# Patient Record
Sex: Female | Born: 1990 | Hispanic: No | Marital: Married | Smoking: Former smoker
Health system: Southern US, Community
[De-identification: ages and names within clinical notes are randomized; demographics above are authoritative.]

## PROBLEM LIST (undated history)

## (undated) DIAGNOSIS — R87629 Unspecified abnormal cytological findings in specimens from vagina: Secondary | ICD-10-CM

## (undated) DIAGNOSIS — N200 Calculus of kidney: Secondary | ICD-10-CM

## (undated) DIAGNOSIS — O039 Complete or unspecified spontaneous abortion without complication: Secondary | ICD-10-CM

## (undated) DIAGNOSIS — J302 Other seasonal allergic rhinitis: Secondary | ICD-10-CM

## (undated) DIAGNOSIS — N189 Chronic kidney disease, unspecified: Secondary | ICD-10-CM

## (undated) HISTORY — PX: FEMUR FRACTURE SURGERY: SHX633

## (undated) HISTORY — DX: Chronic kidney disease, unspecified: N18.9

## (undated) HISTORY — DX: Complete or unspecified spontaneous abortion without complication: O03.9

## (undated) HISTORY — DX: Other seasonal allergic rhinitis: J30.2

## (undated) HISTORY — DX: Unspecified abnormal cytological findings in specimens from vagina: R87.629

## (undated) HISTORY — PX: FRACTURE SURGERY: SHX138

---
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
## 2011-08-15 ENCOUNTER — Emergency Department (HOSPITAL_COMMUNITY)
Admission: EM | Admit: 2011-08-15 | Discharge: 2011-08-16 | Disposition: A | Discharge status: Home / Self Care | Payer: Self-pay | Attending: Emergency Medicine | Admitting: Emergency Medicine

## 2011-08-15 DIAGNOSIS — R197 Diarrhea, unspecified: Secondary | ICD-10-CM | POA: Insufficient documentation

## 2011-08-15 DIAGNOSIS — R112 Nausea with vomiting, unspecified: Secondary | ICD-10-CM | POA: Insufficient documentation

## 2011-08-15 DIAGNOSIS — R1084 Generalized abdominal pain: Secondary | ICD-10-CM | POA: Insufficient documentation

## 2011-08-16 ENCOUNTER — Emergency Department (HOSPITAL_COMMUNITY): Payer: Self-pay

## 2011-08-16 ENCOUNTER — Encounter (HOSPITAL_COMMUNITY): Payer: Self-pay | Admitting: *Deleted

## 2011-08-16 LAB — URINALYSIS, ROUTINE W REFLEX MICROSCOPIC
Bilirubin Urine: NEGATIVE
Glucose, UA: NEGATIVE mg/dL
Nitrite: NEGATIVE
Specific Gravity, Urine: 1.019 (ref 1.005–1.030)
pH: 6.5 (ref 5.0–8.0)

## 2011-08-16 LAB — CBC
HCT: 39.9 % (ref 36.0–46.0)
MCV: 82.8 fL (ref 78.0–100.0)
RBC: 4.82 MIL/uL (ref 3.87–5.11)
RDW: 12.8 % (ref 11.5–15.5)
WBC: 15.4 10*3/uL — ABNORMAL HIGH (ref 4.0–10.5)

## 2011-08-16 LAB — HEPATIC FUNCTION PANEL
ALT: 12 U/L (ref 0–35)
AST: 17 U/L (ref 0–37)
Alkaline Phosphatase: 75 U/L (ref 39–117)
Bilirubin, Direct: 0.2 mg/dL (ref 0.0–0.3)
Indirect Bilirubin: 0 mg/dL — ABNORMAL LOW (ref 0.3–0.9)

## 2011-08-16 LAB — POCT I-STAT, CHEM 8
BUN: 11 mg/dL (ref 6–23)
Calcium, Ion: 1.17 mmol/L (ref 1.12–1.32)
Chloride: 104 mEq/L (ref 96–112)
Glucose, Bld: 96 mg/dL (ref 70–99)
Potassium: 3.6 mEq/L (ref 3.5–5.1)

## 2011-08-16 LAB — DIFFERENTIAL
Basophils Absolute: 0 10*3/uL (ref 0.0–0.1)
Eosinophils Relative: 3 % (ref 0–5)
Lymphocytes Relative: 10 % — ABNORMAL LOW (ref 12–46)
Lymphs Abs: 1.5 10*3/uL (ref 0.7–4.0)
Monocytes Absolute: 1.1 10*3/uL — ABNORMAL HIGH (ref 0.1–1.0)
Neutro Abs: 12.3 10*3/uL — ABNORMAL HIGH (ref 1.7–7.7)

## 2011-08-16 LAB — URINE MICROSCOPIC-ADD ON

## 2011-08-16 MED ORDER — PROMETHAZINE HCL 25 MG RE SUPP: 12.5000 mg | Freq: Four times a day (QID) | RECTAL | Status: DC | PRN

## 2011-08-16 MED ORDER — IOHEXOL 300 MG/ML  SOLN
100.0000 mL | Freq: Once | INTRAMUSCULAR | Status: AC | PRN
  Administered 2011-08-16: 100 mL via INTRAVENOUS

## 2011-08-16 MED ORDER — FENTANYL CITRATE 0.05 MG/ML IJ SOLN
50.0000 ug | Freq: Once | INTRAMUSCULAR | Status: AC
  Administered 2011-08-16: 50 ug via INTRAVENOUS
  Filled 2011-08-16: qty 2

## 2011-08-16 MED ORDER — ONDANSETRON HCL 4 MG/2ML IJ SOLN
4.0000 mg | Freq: Once | INTRAMUSCULAR | Status: AC
  Administered 2011-08-16: 4 mg via INTRAVENOUS
  Filled 2011-08-16: qty 2

## 2011-08-16 MED ORDER — SODIUM CHLORIDE 0.9 % IV BOLUS (SEPSIS)
1000.0000 mL | Freq: Once | INTRAVENOUS | Status: AC
  Administered 2011-08-16: 1000 mL via INTRAVENOUS

## 2011-08-16 MED ORDER — PROMETHAZINE HCL 25 MG PO TABS: 12.5000 mg | ORAL_TABLET | Freq: Four times a day (QID) | ORAL | Status: AC | PRN

## 2011-08-16 MED ORDER — IOHEXOL 300 MG/ML  SOLN
20.0000 mL | INTRAMUSCULAR | Status: AC
  Administered 2011-08-16: 20 mL via ORAL

## 2011-08-16 NOTE — ED Notes (Signed)
Gives several day history of upper abd pain associated with N/V and loose stools, no fever or urinary sx. Period ended on Friday and denies any spotting.

## 2011-08-16 NOTE — ED Provider Notes (Signed)
History     CSN: [id number]  Arrival date & time 08/15/11  2344   First MD Initiated Contact with Patient 08/16/11 0059      Chief Complaint  Patient presents with  . Abdominal Pain  . Emesis    (Consider location/radiation/quality/duration/timing/severity/associated sxs/prior treatment) Patient is a 21 y.o. female presenting with abdominal pain and vomiting. The history is provided by the patient and a parent. No language interpreter was used.  Abdominal Pain The primary symptoms of the illness include abdominal pain, nausea, vomiting and diarrhea. The current episode started more than 2 days ago. The onset of the illness was sudden. Progression since onset: better and now returned this evening.  The abdominal pain began 3 to 5 hours ago. The pain came on gradually. The abdominal pain has been unchanged since its onset. The abdominal pain is generalized. The abdominal pain does not radiate.  The diarrhea began 3 to 5 days ago. The diarrhea is watery. Risk factors: unkown but that resolved on Tuesday.  Associated with: nothing. The patient states that she believes she is currently not pregnant. The patient has had a change in bowel habit. Risk factors: none. Symptoms associated with the illness do not include heartburn, constipation or frequency. Significant associated medical issues do not include PUD or GERD.  Emesis  Associated symptoms include abdominal pain and diarrhea.    History reviewed. No pertinent past medical history.  Past Surgical History  Procedure Date  . Fracture surgery   . Femur fracture surgery     Family History  Problem Relation Age of Onset  . Hypertension Mother   . Nephrolithiasis Mother     History  Substance Use Topics  . Smoking status: Former Smoker    Quit date: 08/09/2011  . Smokeless tobacco: Not on file  . Alcohol Use: No    OB History    Grav Para Term Preterm Abortions TAB SAB Ect Mult Living                  Review of Systems    Gastrointestinal: Positive for nausea, vomiting, abdominal pain and diarrhea. Negative for heartburn and constipation.  Genitourinary: Negative for frequency.  All other systems reviewed and are negative.    Allergies  Review of patient's allergies indicates no known allergies.  Home Medications  No current outpatient prescriptions on file.  BP 134/74  Pulse 94  Temp(Src) 98.3 F (36.8 C) (Oral)  Resp 18  SpO2 100%  LMP 08/12/2011  Physical Exam  Constitutional: She is oriented to person, place, and time. She appears well-developed and well-nourished.  HENT:  Head: Normocephalic and atraumatic.  Mouth/Throat: Oropharynx is clear and moist. No oropharyngeal exudate.  Eyes: Conjunctivae are normal. Pupils are equal, round, and reactive to light.  Neck: Normal range of motion. Neck supple.  Cardiovascular: Normal rate and regular rhythm.   Pulmonary/Chest: Effort normal and breath sounds normal.  Abdominal: Soft. Bowel sounds are normal. There is no tenderness. There is no rebound and no guarding.  Musculoskeletal: Normal range of motion.  Neurological: She is alert and oriented to person, place, and time.  Skin: Skin is warm and dry. She is not diaphoretic.  Psychiatric: She has a normal mood and affect.    ED Course  Procedures (including critical care time)  Labs Reviewed  CBC - Abnormal; Notable for the following:    WBC 15.4 (*)    All other components within normal limits  DIFFERENTIAL - Abnormal; Notable for the following:  Neutrophils Relative 80 (*)    Neutro Abs 12.3 (*)    Lymphocytes Relative 10 (*)    Monocytes Absolute 1.1 (*)    All other components within normal limits  POCT I-STAT, CHEM 8  URINALYSIS, ROUTINE W REFLEX MICROSCOPIC  HEPATIC FUNCTION PANEL  LIPASE, BLOOD   No results found.   No diagnosis found.    MDM  Patient still on menstrual cycle to exam blood in urine.    Patient informed of stones in kidney and to have close  follow up.  Patient and father verbalize understanding and agree to follow up       Vaishnavi Dalby Smitty Cords, MD 08/16/11 [phone]

## 2011-08-16 NOTE — ED Notes (Signed)
Call to CT re: oral contrast completed

## 2011-08-16 NOTE — Discharge Instructions (Signed)
B.R.A.T. Diet Your doctor has recommended the B.R.A.T. diet for you or your child until the condition improves. This is often used to help control diarrhea and vomiting symptoms. If you or your child can tolerate clear liquids, you may have:  Bananas.   Rice.   Applesauce.   Toast (and other simple starches such as crackers, potatoes, noodles).  Be sure to avoid dairy products, meats, and fatty foods until symptoms are better. Fruit juices such as apple, grape, and prune juice can make diarrhea worse. Avoid these. Continue this diet for 2 days or as instructed by your caregiver. Document Released: 03/31/2005 Document Revised: 03/20/2011 Document Reviewed: 09/17/2006 ExitCare Patient Information 2012 [employer]. 

## 2011-08-16 NOTE — ED Notes (Addendum)
C/o stomach cramps and nv. Onset Tuesday. Comes and goes. Went away Tuesday and came back tonight around 2200. last emesis at ~ 15 minutes PTA. Had diarrhea on Tuesday. Last BM this am (normal). No meds PTA. "no chance of pregnancy". Last ate 1900. (denies: dizziness, fever, urinary or vaginal sx, back pain, bleeding or other sx).

## 2013-02-25 ENCOUNTER — Encounter: Payer: Self-pay | Admitting: *Deleted

## 2013-03-01 ENCOUNTER — Other Ambulatory Visit: Payer: Self-pay | Admitting: Obstetrics & Gynecology

## 2013-03-01 DIAGNOSIS — O3680X0 Pregnancy with inconclusive fetal viability, not applicable or unspecified: Secondary | ICD-10-CM

## 2013-03-02 ENCOUNTER — Encounter (INDEPENDENT_AMBULATORY_CARE_PROVIDER_SITE_OTHER): Payer: Self-pay

## 2013-03-02 ENCOUNTER — Ambulatory Visit (INDEPENDENT_AMBULATORY_CARE_PROVIDER_SITE_OTHER): Payer: Medicaid Other

## 2013-03-02 DIAGNOSIS — O3680X0 Pregnancy with inconclusive fetal viability, not applicable or unspecified: Secondary | ICD-10-CM

## 2013-03-02 NOTE — Progress Notes (Signed)
U/S(6+5wks)-single IUP with +FCA noted, FHR-134 bpm, cx long and closed, bilateral adnexa WNL, no free fluid noted within pelvis, CRL c/w LMP dates

## 2013-03-15 ENCOUNTER — Ambulatory Visit (INDEPENDENT_AMBULATORY_CARE_PROVIDER_SITE_OTHER): Payer: Medicaid Other | Admitting: Women's Health

## 2013-03-15 ENCOUNTER — Other Ambulatory Visit (HOSPITAL_COMMUNITY)
Admission: RE | Admit: 2013-03-15 | Discharge: 2013-03-15 | Disposition: A | Discharge status: Home / Self Care | Payer: Medicaid Other | Source: Ambulatory Visit | Attending: Obstetrics & Gynecology | Admitting: Obstetrics & Gynecology

## 2013-03-15 ENCOUNTER — Encounter: Payer: Self-pay | Admitting: Women's Health

## 2013-03-15 VITALS — BP 112/64 | Ht 61.0 in | Wt 175.8 lb

## 2013-03-15 DIAGNOSIS — O360990 Maternal care for other rhesus isoimmunization, unspecified trimester, not applicable or unspecified: Secondary | ICD-10-CM

## 2013-03-15 DIAGNOSIS — Z3401 Encounter for supervision of normal first pregnancy, first trimester: Secondary | ICD-10-CM

## 2013-03-15 DIAGNOSIS — Z1151 Encounter for screening for human papillomavirus (HPV): Secondary | ICD-10-CM | POA: Insufficient documentation

## 2013-03-15 DIAGNOSIS — O219 Vomiting of pregnancy, unspecified: Secondary | ICD-10-CM

## 2013-03-15 DIAGNOSIS — Z113 Encounter for screening for infections with a predominantly sexual mode of transmission: Secondary | ICD-10-CM | POA: Insufficient documentation

## 2013-03-15 DIAGNOSIS — Z331 Pregnant state, incidental: Secondary | ICD-10-CM

## 2013-03-15 DIAGNOSIS — Z1389 Encounter for screening for other disorder: Secondary | ICD-10-CM

## 2013-03-15 DIAGNOSIS — Z124 Encounter for screening for malignant neoplasm of cervix: Secondary | ICD-10-CM | POA: Insufficient documentation

## 2013-03-15 DIAGNOSIS — Z3400 Encounter for supervision of normal first pregnancy, unspecified trimester: Secondary | ICD-10-CM | POA: Insufficient documentation

## 2013-03-15 DIAGNOSIS — R87810 Cervical high risk human papillomavirus (HPV) DNA test positive: Secondary | ICD-10-CM | POA: Insufficient documentation

## 2013-03-15 DIAGNOSIS — O210 Mild hyperemesis gravidarum: Secondary | ICD-10-CM

## 2013-03-15 DIAGNOSIS — Z23 Encounter for immunization: Secondary | ICD-10-CM

## 2013-03-15 LAB — POCT URINALYSIS DIPSTICK
Ketones, UA: NEGATIVE
Leukocytes, UA: NEGATIVE
Nitrite, UA: NEGATIVE
Protein, UA: NEGATIVE

## 2013-03-15 LAB — CBC
MCH: 29.2 pg (ref 26.0–34.0)
MCHC: 34 g/dL (ref 30.0–36.0)
Platelets: 216 10*3/uL (ref 150–400)
RDW: 13.7 % (ref 11.5–15.5)

## 2013-03-15 MED ORDER — DOXYLAMINE-PYRIDOXINE 10-10 MG PO TBEC: 10.0000 mg | DELAYED_RELEASE_TABLET | ORAL | Status: DC

## 2013-03-15 MED ORDER — INFLUENZA VAC SPLIT QUAD 0.5 ML IM SUSP
0.5000 mL | Freq: Once | INTRAMUSCULAR | Status: AC
  Administered 2013-03-15: 0.5 mL via INTRAMUSCULAR

## 2013-03-15 NOTE — Progress Notes (Addendum)
  Subjective:    Jodi King is a 22 y.o. G1P0 Caucasian female at [redacted]w[redacted]d by LMP which correlates well w/ 6wk u/s, being seen today for her first obstetrical visit.  Her obstetrical history is significant for smoker and primigravida.  She was smoking 1ppd and drinking etoh often prior to pregnancy, has quit both since +PT. Pregnancy history fully reviewed.   Patient reports daily n/v. Denies cramping, vb, uti s/s. Ceasar Mons Vitals:   03/15/13 0938  BP: 112/64  Weight: 175 lb 12.8 oz (79.742 kg)    HISTORY: OB History  Gravida Para Term Preterm AB SAB TAB Ectopic Multiple Living  1             # Outcome Date GA Lbr Len/2nd Weight Sex Delivery Anes PTL Lv  1 CUR              Past Medical History  Diagnosis Date  . Chronic kidney disease     kidney stones  . Miscarriage    Past Surgical History  Procedure Laterality Date  . Fracture surgery    . Femur fracture surgery     Family History  Problem Relation Age of Onset  . Hypertension Mother   . Nephrolithiasis Mother      Exam    Pelvic Exam:    Perineum: Normal Perineum   Vulva: normal   Vagina:  normal mucosa, normal discharge, no palpable nodules   Uterus   normal size/shape/contour for ga     Cervix: normal   Adnexa: Not palpable   Urinary: urethral meatus normal    System:     Skin: normal coloration and turgor, no rashes    Neurologic: oriented, normal mood   Extremities: normal strength, tone, and muscle mass   HEENT PERRLA   Mouth/Teeth mucous membranes moist   Cardiovascular: regular rate and rhythm   Respiratory:  appears well, vitals normal, no respiratory distress, acyanotic, normal RR   Abdomen: soft, non-tender    Thin prep pap smear obtained FHR 150s via informal transabdominal u/s   Assessment:    Pregnancy: G1P0 Patient Active Problem List   Diagnosis Date Noted  . Supervision of normal first pregnancy 03/15/2013    Priority: High      [redacted]w[redacted]d G1P0 New OB visit Previous  smoker N/V of pregnancy    Plan:     Initial labs drawn Continue prenatal vitamins Problem list reviewed and updated Reviewed n/v relief measures and warning s/s to report Rx diclegis, prior auth sent, and 2 samples given Reviewed recommended weight gain based on pre-gravid BMI Encouraged well-balanced diet Genetic Screening discussed Integrated Screen: requested Cystic fibrosis screening discussed requested Ultrasound discussed; fetal survey: requested Follow up in 4 weeks for 1st it/nt and visit [facility] referral faxed  Marge Duncans 03/15/2013 10:41 AM

## 2013-03-15 NOTE — Patient Instructions (Signed)
Nausea & Vomiting  Have saltine crackers or pretzels by your bed and eat a few bites before you raise your head out of bed in the morning  Eat small frequent meals throughout the day instead of large meals  Drink plenty of fluids throughout the day to stay hydrated, just don't drink a lot of fluids with your meals.  This can make your stomach fill up faster making you feel sick  Do not brush your teeth right after you eat  Products with real ginger are good for nausea, like ginger ale and ginger hard candy Make sure it says made with real ginger!  Sucking on sour candy like lemon heads is also good for nausea  If your prenatal vitamins make you nauseated, take them at night so you will sleep through the nausea  If you feel like you need medicine for the nausea & vomiting please let us know  If you are unable to keep any fluids or food down please let us know    Pregnancy - First Trimester During sexual intercourse, millions of sperm go into the vagina. Only 1 sperm will penetrate and fertilize the female egg while it is in the Fallopian tube. One week later, the fertilized egg implants into the wall of the uterus. An embryo begins to develop into a baby. At 6 to 8 weeks, the eyes and face are formed and the heartbeat can be seen on ultrasound. At the end of 12 weeks (first trimester), all the baby's organs are formed. Now that you are pregnant, you will want to do everything you can to have a healthy baby. Two of the most important things are to get good prenatal care and follow your caregiver's instructions. Prenatal care is all the medical care you receive before the baby's birth. It is given to prevent, find, and treat problems during the pregnancy and childbirth. PRENATAL EXAMS  During prenatal visits, your weight, blood pressure, and urine are checked. This is done to make sure you are healthy and progressing normally during the pregnancy.  A pregnant woman should gain 25 to 35 pounds  during the pregnancy. However, if you are overweight or underweight, your caregiver will advise you regarding your weight.  Your caregiver will ask and answer questions for you.  Blood work, cervical cultures, other necessary tests, and a Pap test are done during your prenatal exams. These tests are done to check on your health and the probable health of your baby. Tests are strongly recommended and done for HIV with your permission. This is the virus that causes AIDS. These tests are done because medicines can be given to help prevent your baby from being born with this infection should you have been infected without knowing it. Blood work is also used to find out your blood type, previous infections, and follow your blood levels (hemoglobin).  Low hemoglobin (anemia) is common during pregnancy. Iron and vitamins are given to help prevent this. Later in the pregnancy, blood tests for diabetes will be done along with any other tests if any problems develop.  You may need other tests to make sure you and the baby are doing well. CHANGES DURING THE FIRST TRIMESTER  Your body goes through many changes during pregnancy. They vary from person to person. Talk to your caregiver about changes you notice and are concerned about. Changes can include:  Your menstrual period stops.  The egg and sperm carry the genes that determine what you look like. Genes from you   and your partner are forming a baby. The female genes determine whether the baby is a boy or a girl.  Your body increases in girth and you may feel bloated.  Feeling sick to your stomach (nauseous) and throwing up (vomiting). If the vomiting is uncontrollable, call your caregiver.  Your breasts will begin to enlarge and become tender.  Your nipples may stick out more and become darker.  The need to urinate more. Painful urination may mean you have a bladder infection.  Tiring easily.  Loss of appetite.  Cravings for certain kinds of  food.  At first, you may gain or lose a couple of pounds.  You may have changes in your emotions from day to day (excited to be pregnant or concerned something may go wrong with the pregnancy and baby).  You may have more vivid and strange dreams. HOME CARE INSTRUCTIONS   It is very important to avoid all smoking, alcohol and non-prescribed drugs during your pregnancy. These affect the formation and growth of the baby. Avoid chemicals while pregnant to ensure the delivery of a healthy infant.  Start your prenatal visits by the 12th week of pregnancy. They are usually scheduled monthly at first, then more often in the last 2 months before delivery. Keep your caregiver's appointments. Follow your caregiver's instructions regarding medicine use, blood and lab tests, exercise, and diet.  During pregnancy, you are providing food for you and your baby. Eat regular, well-balanced meals. Choose foods such as meat, fish, milk and other low fat dairy products, vegetables, fruits, and whole-grain breads and cereals. Your caregiver will tell you of the ideal weight gain.  You can help morning sickness by keeping soda crackers at the bedside. Eat a couple before arising in the morning. You may want to use the crackers without salt on them.  Eating 4 to 5 small meals rather than 3 large meals a day also may help the nausea and vomiting.  Drinking liquids between meals instead of during meals also seems to help nausea and vomiting.  A physical sexual relationship may be continued throughout pregnancy if there are no other problems. Problems may be early (premature) leaking of amniotic fluid from the membranes, vaginal bleeding, or belly (abdominal) pain.  Exercise regularly if there are no restrictions. Check with your caregiver or physical therapist if you are unsure of the safety of some of your exercises. Greater weight gain will occur in the last 2 trimesters of pregnancy. Exercising will  help:  Control your weight.  Keep you in shape.  Prepare you for labor and delivery.  Help you lose your pregnancy weight after you deliver your baby.  Wear a good support or jogging bra for breast tenderness during pregnancy. This may help if worn during sleep too.  Ask when prenatal classes are available. Begin classes when they are offered.  Do not use hot tubs, steam rooms, or saunas.  Wear your seat belt when driving. This protects you and your baby if you are in an accident.  Avoid raw meat, uncooked cheese, cat litter boxes, and soil used by cats throughout the pregnancy. These carry germs that can cause birth defects in the baby.  The first trimester is a good time to visit your dentist for your dental health. Getting your teeth cleaned is okay. Use a softer toothbrush and brush gently during pregnancy.  Ask for help if you have financial, counseling, or nutritional needs during pregnancy. Your caregiver will be able to offer counseling for   these needs as well as refer you for other special needs.  Do not take any medicines or herbs unless told by your caregiver.  Inform your caregiver if there is any mental or physical domestic violence.  Make a list of emergency phone numbers of family, friends, hospital, and police and fire departments.  Write down your questions. Take them to your prenatal visit.  Do not douche.  Do not cross your legs.  If you have to stand for long periods of time, rotate you feet or take small steps in a circle.  You may have more vaginal secretions that may require a sanitary pad. Do not use tampons or scented sanitary pads. MEDICINES AND DRUG USE IN PREGNANCY  Take prenatal vitamins as directed. The vitamin should contain 1 milligram of folic acid. Keep all vitamins out of reach of children. Only a couple vitamins or tablets containing iron may be fatal to a baby or young child when ingested.  Avoid use of all medicines, including herbs,  over-the-counter medicines, not prescribed or suggested by your caregiver. Only take over-the-counter or prescription medicines for pain, discomfort, or fever as directed by your caregiver. Do not use aspirin, ibuprofen, or naproxen unless directed by your caregiver.  Let your caregiver also know about herbs you may be using.  Alcohol is related to a number of birth defects. This includes fetal alcohol syndrome. All alcohol, in any form, should be avoided completely. Smoking will cause low birth rate and premature babies.  Street or illegal drugs are very harmful to the baby. They are absolutely forbidden. A baby born to an addicted mother will be addicted at birth. The baby will go through the same withdrawal an adult does.  Let your caregiver know about any medicines that you have to take and for what reason you take them. SEEK MEDICAL CARE IF:  You have any concerns or worries during your pregnancy. It is better to call with your questions if you feel they cannot wait, rather than worry about them. SEEK IMMEDIATE MEDICAL CARE IF:   An unexplained oral temperature above 102 F (38.9 C) develops, or as your caregiver suggests.  You have leaking of fluid from the vagina (birth canal). If leaking membranes are suspected, take your temperature and inform your caregiver of this when you call.  There is vaginal spotting or bleeding. Notify your caregiver of the amount and how many pads are used.  You develop a bad smelling vaginal discharge with a change in the color.  You continue to feel sick to your stomach (nauseated) and have no relief from remedies suggested. You vomit blood or coffee ground-like materials.  You lose more than 2 pounds of weight in 1 week.  You gain more than 2 pounds of weight in 1 week and you notice swelling of your face, hands, feet, or legs.  You gain 5 pounds or more in 1 week (even if you do not have swelling of your hands, face, legs, or feet).  You get  exposed to German measles and have never had them.  You are exposed to fifth disease or chickenpox.  You develop belly (abdominal) pain. Round ligament discomfort is a common non-cancerous (benign) cause of abdominal pain in pregnancy. Your caregiver still must evaluate this.  You develop headache, fever, diarrhea, pain with urination, or shortness of breath.  You fall or are in a car accident or have any kind of trauma.  There is mental or physical violence in your home. Document   Released: 03/25/2001 Document Revised: 12/24/2011 Document Reviewed: 09/26/2008 [employer] Patient Information 2014 [employer].  

## 2013-03-16 LAB — URINALYSIS
Hgb urine dipstick: NEGATIVE
Ketones, ur: NEGATIVE mg/dL
Leukocytes, UA: NEGATIVE
Nitrite: NEGATIVE
Protein, ur: NEGATIVE mg/dL
Urobilinogen, UA: 0.2 mg/dL (ref 0.0–1.0)
pH: 8.5 — ABNORMAL HIGH (ref 5.0–8.0)

## 2013-03-16 LAB — DRUG SCREEN, URINE, NO CONFIRMATION
Amphetamine Screen, Ur: NEGATIVE
Barbiturate Quant, Ur: NEGATIVE
Benzodiazepines.: NEGATIVE
Methadone: NEGATIVE
Propoxyphene: NEGATIVE

## 2013-03-16 LAB — CYSTIC FIBROSIS DIAGNOSTIC STUDY

## 2013-03-16 LAB — OXYCODONE SCREEN, UA, RFLX CONFIRM: Oxycodone Screen, Ur: NEGATIVE ng/mL

## 2013-03-16 LAB — URINE CULTURE

## 2013-03-16 LAB — ANTIBODY SCREEN: Antibody Screen: NEGATIVE

## 2013-03-16 LAB — ABO AND RH: Rh Type: NEGATIVE

## 2013-03-16 LAB — HEPATITIS B SURFACE ANTIGEN: Hepatitis B Surface Ag: NEGATIVE

## 2013-03-16 LAB — RUBELLA SCREEN: Rubella: 2.57 Index — ABNORMAL HIGH (ref <0.90)

## 2013-03-16 LAB — HIV ANTIBODY (ROUTINE TESTING W REFLEX): HIV: NONREACTIVE

## 2013-03-16 LAB — RPR

## 2013-03-18 ENCOUNTER — Encounter: Payer: Self-pay | Admitting: Women's Health

## 2013-03-18 DIAGNOSIS — Z6791 Unspecified blood type, Rh negative: Secondary | ICD-10-CM | POA: Insufficient documentation

## 2013-03-18 DIAGNOSIS — O26899 Other specified pregnancy related conditions, unspecified trimester: Secondary | ICD-10-CM

## 2013-03-18 DIAGNOSIS — Z283 Underimmunization status: Secondary | ICD-10-CM

## 2013-03-18 DIAGNOSIS — O09899 Supervision of other high risk pregnancies, unspecified trimester: Secondary | ICD-10-CM | POA: Insufficient documentation

## 2013-03-19 ENCOUNTER — Encounter: Payer: Self-pay | Admitting: Women's Health

## 2013-03-21 ENCOUNTER — Telehealth: Payer: Self-pay | Admitting: Women's Health

## 2013-03-21 ENCOUNTER — Encounter: Payer: Self-pay | Admitting: Women's Health

## 2013-03-21 NOTE — Telephone Encounter (Signed)
No answer x 2. Will send note through my chart about abnormal pap and need for colpo at >[redacted]wks GA.  Cheral Marker, CNM, [facility] 03/21/2013 1:34 PM

## 2013-03-21 NOTE — Telephone Encounter (Signed)
Attempting to call pt to notify her of abnormal pap. No answer, no option to leave voicemail.  Cheral Marker, CNM, [facility] 03/21/2013 1:27 PM

## 2013-04-01 ENCOUNTER — Telehealth: Payer: Self-pay | Admitting: Obstetrics & Gynecology

## 2013-04-01 NOTE — Telephone Encounter (Signed)
Informed pt Diclegis e-scribed to [place], [place].

## 2013-04-01 NOTE — Telephone Encounter (Signed)
Spoke with pt letting her know pap smear came back showing some abnormal cells. Pt needs to have a colpo after the 1st trimester. Has a scheduled appt on 04/12/13 but will still be in the 1st trimester so I advised procedure would not be done that day. Pt also asked about Diclegis and I let her know that it had been sent to pharmacy. Pt voiced understanding. JSY

## 2013-04-12 ENCOUNTER — Other Ambulatory Visit: Payer: Self-pay | Admitting: Obstetrics & Gynecology

## 2013-04-12 ENCOUNTER — Ambulatory Visit (INDEPENDENT_AMBULATORY_CARE_PROVIDER_SITE_OTHER): Payer: Medicaid Other

## 2013-04-12 ENCOUNTER — Encounter: Payer: Self-pay | Admitting: Obstetrics & Gynecology

## 2013-04-12 ENCOUNTER — Other Ambulatory Visit: Payer: Self-pay | Admitting: Women's Health

## 2013-04-12 ENCOUNTER — Ambulatory Visit (INDEPENDENT_AMBULATORY_CARE_PROVIDER_SITE_OTHER): Payer: Medicaid Other | Admitting: Obstetrics & Gynecology

## 2013-04-12 VITALS — BP 112/72 | Wt 175.5 lb

## 2013-04-12 DIAGNOSIS — Z3401 Encounter for supervision of normal first pregnancy, first trimester: Secondary | ICD-10-CM

## 2013-04-12 DIAGNOSIS — Z1389 Encounter for screening for other disorder: Secondary | ICD-10-CM

## 2013-04-12 DIAGNOSIS — O360990 Maternal care for other rhesus isoimmunization, unspecified trimester, not applicable or unspecified: Secondary | ICD-10-CM

## 2013-04-12 DIAGNOSIS — Z36 Encounter for antenatal screening of mother: Secondary | ICD-10-CM

## 2013-04-12 DIAGNOSIS — Z3402 Encounter for supervision of normal first pregnancy, second trimester: Secondary | ICD-10-CM

## 2013-04-12 DIAGNOSIS — Z331 Pregnant state, incidental: Secondary | ICD-10-CM

## 2013-04-12 LAB — POCT URINALYSIS DIPSTICK
Blood, UA: NEGATIVE
Ketones, UA: NEGATIVE
Protein, UA: NEGATIVE

## 2013-04-12 NOTE — Progress Notes (Signed)
Sciatica, reviewed standard strategies for management No bleeding  No complaints Follow up in 4 weeks

## 2013-04-12 NOTE — Progress Notes (Signed)
U/S(12+4wks)-single IUP with +FCA noted, FHR-167 bpm, cx long and closed (3.3cm), bilateral adnexa wnl, NB present, NT-1.91mm, CRL c/w dates

## 2013-04-14 NOTE — L&D Delivery Note (Signed)
Delivery Note At 5:39 PM a viable female was delivered via Vaginal, Spontaneous Delivery (Presentation: Left Occiput Anterior).  APGAR: 8, 9; weight TBD.   Placenta status: Intact, Spontaneous.  Cord: 3 vessels with the following complications: None.  Cord pH: 7.33  Anesthesia: Epidural  Episiotomy: Median Lacerations: 2nd degree Suture Repair: 3.0 vicryl rapide Est. Blood Loss (mL): 450cc  Mom to postpartum.  Baby to Couplet care / Skin to Skin.  Pt pushed for 1 hour with deep decelerations for the last 15 min down to the 90s with each contraction.  In the last 2 contractions the HR remained in the 70s between contractions. Mayo scissors were used to cut a median episiotomy and pt pushed one time after that and delivered a liveborn female via NSVD with spontaneous cry.  Tight nuchal cord x1 that had to be delivered through. Baby placed on maternal abdomen.  Delayed cord clamping performed.  Cord cut by FOB.  Placenta delivered intact with 3V cord via traction and pitocin.  2nd degree tear from episiotomy that did not extend. No complications.  Mom and baby to postpartum.   BECK, KELI L 10/11/2013, 6:12 PM

## 2013-04-18 LAB — MATERNAL SCREEN, INTEGRATED #1

## 2013-05-10 ENCOUNTER — Ambulatory Visit (INDEPENDENT_AMBULATORY_CARE_PROVIDER_SITE_OTHER): Payer: Medicaid Other | Admitting: Women's Health

## 2013-05-10 ENCOUNTER — Encounter: Payer: Self-pay | Admitting: Women's Health

## 2013-05-10 ENCOUNTER — Other Ambulatory Visit: Payer: Self-pay | Admitting: Obstetrics & Gynecology

## 2013-05-10 VITALS — BP 104/64 | Wt 175.4 lb

## 2013-05-10 DIAGNOSIS — Z1389 Encounter for screening for other disorder: Secondary | ICD-10-CM

## 2013-05-10 DIAGNOSIS — O9989 Other specified diseases and conditions complicating pregnancy, childbirth and the puerperium: Secondary | ICD-10-CM

## 2013-05-10 DIAGNOSIS — Z3400 Encounter for supervision of normal first pregnancy, unspecified trimester: Secondary | ICD-10-CM

## 2013-05-10 DIAGNOSIS — Z331 Pregnant state, incidental: Secondary | ICD-10-CM

## 2013-05-10 DIAGNOSIS — O99891 Other specified diseases and conditions complicating pregnancy: Secondary | ICD-10-CM

## 2013-05-10 DIAGNOSIS — O360990 Maternal care for other rhesus isoimmunization, unspecified trimester, not applicable or unspecified: Secondary | ICD-10-CM

## 2013-05-10 LAB — POCT URINALYSIS DIPSTICK
Glucose, UA: NEGATIVE
Ketones, UA: NEGATIVE
Leukocytes, UA: NEGATIVE
Nitrite, UA: NEGATIVE
PROTEIN UA: NEGATIVE

## 2013-05-10 NOTE — Patient Instructions (Signed)
Second Trimester of Pregnancy The second trimester is from week 13 through week 28, months 4 through 6. The second trimester is often a time when you feel your best. Your body has also adjusted to being pregnant, and you begin to feel better physically. Usually, morning sickness has lessened or quit completely, you may have more energy, and you may have an increase in appetite. The second trimester is also a time when the fetus is growing rapidly. At the end of the sixth month, the fetus is about 9 inches long and weighs about 1 pounds. You will likely begin to feel the baby move (quickening) between 18 and 20 weeks of the pregnancy. BODY CHANGES Your body goes through many changes during pregnancy. The changes vary from woman to woman.   Your weight will continue to increase. You will notice your lower abdomen bulging out.  You may begin to get stretch marks on your hips, abdomen, and breasts.  You may develop headaches that can be relieved by medicines approved by your caregiver.  You may urinate more often because the fetus is pressing on your bladder.  You may develop or continue to have heartburn as a result of your pregnancy.  You may develop constipation because certain hormones are causing the muscles that push waste through your intestines to slow down.  You may develop hemorrhoids or swollen, bulging veins (varicose veins).  You may have back pain because of the weight gain and pregnancy hormones relaxing your joints between the bones in your pelvis and as a result of a shift in weight and the muscles that support your balance.  Your breasts will continue to grow and be tender.  Your gums may bleed and may be sensitive to brushing and flossing.  Dark spots or blotches (chloasma, mask of pregnancy) may develop on your face. This will likely fade after the baby is born.  A dark line from your belly button to the pubic area (linea nigra) may appear. This will likely fade after the  baby is born. WHAT TO EXPECT AT YOUR PRENATAL VISITS During a routine prenatal visit:  You will be weighed to make sure you and the fetus are growing normally.  Your blood pressure will be taken.  Your abdomen will be measured to track your baby's growth.  The fetal heartbeat will be listened to.  Any test results from the previous visit will be discussed. Your caregiver may ask you:  How you are feeling.  If you are feeling the baby move.  If you have had any abnormal symptoms, such as leaking fluid, bleeding, severe headaches, or abdominal cramping.  If you have any questions. Other tests that may be performed during your second trimester include:  Blood tests that check for:  Low iron levels (anemia).  Gestational diabetes (between 24 and 28 weeks).  Rh antibodies.  Urine tests to check for infections, diabetes, or protein in the urine.  An ultrasound to confirm the proper growth and development of the baby.  An amniocentesis to check for possible genetic problems.  Fetal screens for spina bifida and Down syndrome. HOME CARE INSTRUCTIONS   Avoid all smoking, herbs, alcohol, and unprescribed drugs. These chemicals affect the formation and growth of the baby.  Follow your caregiver's instructions regarding medicine use. There are medicines that are either safe or unsafe to take during pregnancy.  Exercise only as directed by your caregiver. Experiencing uterine cramps is a good sign to stop exercising.  Continue to eat regular,   healthy meals.  Wear a good support bra for breast tenderness.  Do not use hot tubs, steam rooms, or saunas.  Wear your seat belt at all times when driving.  Avoid raw meat, uncooked cheese, cat litter boxes, and soil used by cats. These carry germs that can cause birth defects in the baby.  Take your prenatal vitamins.  Try taking a stool softener (if your caregiver approves) if you develop constipation. Eat more high-fiber foods,  such as fresh vegetables or fruit and whole grains. Drink plenty of fluids to keep your urine clear or pale yellow.  Take warm sitz baths to soothe any pain or discomfort caused by hemorrhoids. Use hemorrhoid cream if your caregiver approves.  If you develop varicose veins, wear support hose. Elevate your feet for 15 minutes, 3 4 times a day. Limit salt in your diet.  Avoid heavy lifting, wear low heel shoes, and practice good posture.  Rest with your legs elevated if you have leg cramps or low back pain.  Visit your dentist if you have not gone yet during your pregnancy. Use a soft toothbrush to brush your teeth and be gentle when you floss.  A sexual relationship may be continued unless your caregiver directs you otherwise.  Continue to go to all your prenatal visits as directed by your caregiver. SEEK MEDICAL CARE IF:   You have dizziness.  You have mild pelvic cramps, pelvic pressure, or nagging pain in the abdominal area.  You have persistent nausea, vomiting, or diarrhea.  You have a bad smelling vaginal discharge.  You have pain with urination. SEEK IMMEDIATE MEDICAL CARE IF:   You have a fever.  You are leaking fluid from your vagina.  You have spotting or bleeding from your vagina.  You have severe abdominal cramping or pain.  You have rapid weight gain or loss.  You have shortness of breath with chest pain.  You notice sudden or extreme swelling of your face, hands, ankles, feet, or legs.  You have not felt your baby move in over an hour.  You have severe headaches that do not go away with medicine.  You have vision changes. Document Released: 03/25/2001 Document Revised: 12/01/2012 Document Reviewed: 06/01/2012 [employer] Patient Information 2014 [employer].  

## 2013-05-10 NOTE — Progress Notes (Signed)
Reports feeling some fm. Denies uc's, lof, vb, uti s/s.  No complaints.  Reviewed ptl s/s.  All questions answered. F/U in 4wks for anatomy u/s and visit w/ MD for colpo.  2nd IT today.

## 2013-05-13 LAB — MATERNAL SCREEN, INTEGRATED #2
AFP MoM: 0.88
AFP, Serum: 32.7 ng/mL
Age risk Down Syndrome: 1:1100 {titer}
CALCULATED GESTATIONAL AGE MAT SCREEN: 16.7
Crown Rump Length: 65.1 mm
Estriol Mom: 0.98
Estriol, Free: 0.97 ng/mL
HCG, SERUM MAT SCREEN: 17.7 [IU]/mL
Inhibin A Dimeric: 227 pg/mL
Inhibin A MoM: 1.43
MSS Down Syndrome: <1:5000 {titer}
NT MOM MAT SCREEN: 1.21
NUCHAL TRANSLUCENCY MAT SCREEN 2: 1.78 mm
Number of fetuses: 1
PAPP-A MOM MAT SCREEN: 0.75
PAPP-A: 611 ng/mL
Rish for ONTD: <1:5000 {titer}
hCG MoM: 0.61

## 2013-06-07 ENCOUNTER — Encounter: Payer: Medicaid Other | Admitting: Obstetrics & Gynecology

## 2013-06-07 ENCOUNTER — Other Ambulatory Visit: Payer: Medicaid Other

## 2013-06-08 ENCOUNTER — Ambulatory Visit (INDEPENDENT_AMBULATORY_CARE_PROVIDER_SITE_OTHER): Payer: Medicaid Other

## 2013-06-08 ENCOUNTER — Other Ambulatory Visit: Payer: Self-pay | Admitting: Women's Health

## 2013-06-08 ENCOUNTER — Ambulatory Visit (INDEPENDENT_AMBULATORY_CARE_PROVIDER_SITE_OTHER): Payer: Medicaid Other | Admitting: Women's Health

## 2013-06-08 ENCOUNTER — Encounter: Payer: Self-pay | Admitting: Women's Health

## 2013-06-08 VITALS — BP 130/60 | Wt 183.0 lb

## 2013-06-08 DIAGNOSIS — Z1389 Encounter for screening for other disorder: Secondary | ICD-10-CM

## 2013-06-08 DIAGNOSIS — Z3400 Encounter for supervision of normal first pregnancy, unspecified trimester: Secondary | ICD-10-CM

## 2013-06-08 DIAGNOSIS — Z331 Pregnant state, incidental: Secondary | ICD-10-CM

## 2013-06-08 DIAGNOSIS — O98519 Other viral diseases complicating pregnancy, unspecified trimester: Secondary | ICD-10-CM

## 2013-06-08 DIAGNOSIS — O99891 Other specified diseases and conditions complicating pregnancy: Secondary | ICD-10-CM

## 2013-06-08 DIAGNOSIS — O360990 Maternal care for other rhesus isoimmunization, unspecified trimester, not applicable or unspecified: Secondary | ICD-10-CM

## 2013-06-08 DIAGNOSIS — O9989 Other specified diseases and conditions complicating pregnancy, childbirth and the puerperium: Secondary | ICD-10-CM

## 2013-06-08 LAB — POCT URINALYSIS DIPSTICK
Glucose, UA: NEGATIVE
Ketones, UA: NEGATIVE
Leukocytes, UA: NEGATIVE
Nitrite, UA: NEGATIVE
PROTEIN UA: NEGATIVE

## 2013-06-08 NOTE — Patient Instructions (Signed)
Sciatica Sciatica is pain, weakness, numbness, or tingling along the path of the sciatic nerve. The nerve starts in the lower back and runs down the back of each leg. The nerve controls the muscles in the lower leg and in the back of the knee, while also providing sensation to the back of the thigh, lower leg, and the sole of your foot. Sciatica is a symptom of another medical condition. For instance, nerve damage or certain conditions, such as a herniated disk or bone spur on the spine, pinch or put pressure on the sciatic nerve. This causes the pain, weakness, or other sensations normally associated with sciatica. Generally, sciatica only affects one side of the body. CAUSES   Herniated or slipped disc.  Degenerative disk disease.  A pain disorder involving the narrow muscle in the buttocks (piriformis syndrome).  Pelvic injury or fracture.  Pregnancy.  Tumor (rare). SYMPTOMS  Symptoms can vary from mild to very severe. The symptoms usually travel from the low back to the buttocks and down the back of the leg. Symptoms can include:  Mild tingling or dull aches in the lower back, leg, or hip.  Numbness in the back of the calf or sole of the foot.  Burning sensations in the lower back, leg, or hip.  Sharp pains in the lower back, leg, or hip.  Leg weakness.  Severe back pain inhibiting movement. These symptoms may get worse with coughing, sneezing, laughing, or prolonged sitting or standing. Also, being overweight may worsen symptoms. DIAGNOSIS  Your caregiver will perform a physical exam to look for common symptoms of sciatica. He or she may ask you to do certain movements or activities that would trigger sciatic nerve pain. Other tests may be performed to find the cause of the sciatica. These may include:  Blood tests.  X-rays.  Imaging tests, such as an MRI or CT scan. TREATMENT  Treatment is directed at the cause of the sciatic pain. Sometimes, treatment is not necessary  and the pain and discomfort goes away on its own. If treatment is needed, your caregiver may suggest:  Over-the-counter medicines to relieve pain.  Prescription medicines, such as anti-inflammatory medicine, muscle relaxants, or narcotics.  Applying heat or ice to the painful area.  Steroid injections to lessen pain, irritation, and inflammation around the nerve.  Reducing activity during periods of pain.  Exercising and stretching to strengthen your abdomen and improve flexibility of your spine. Your caregiver may suggest losing weight if the extra weight makes the back pain worse.  Physical therapy.  Surgery to eliminate what is pressing or pinching the nerve, such as a bone spur or part of a herniated disk. HOME CARE INSTRUCTIONS   Only take over-the-counter or prescription medicines for pain or discomfort as directed by your caregiver.  Apply ice to the affected area for 20 minutes, 3 4 times a day for the first 48 72 hours. Then try heat in the same way.  Exercise, stretch, or perform your usual activities if these do not aggravate your pain.  Attend physical therapy sessions as directed by your caregiver.  Keep all follow-up appointments as directed by your caregiver.  Do not wear high heels or shoes that do not provide proper support.  Check your mattress to see if it is too soft. A firm mattress may lessen your pain and discomfort. SEEK IMMEDIATE MEDICAL CARE IF:   You lose control of your bowel or bladder (incontinence).  You have increasing weakness in the lower back,   pelvis, buttocks, or legs.  You have redness or swelling of your back.  You have a burning sensation when you urinate.  You have pain that gets worse when you lie down or awakens you at night.  Your pain is worse than you have experienced in the past.  Your pain is lasting longer than 4 weeks.  You are suddenly losing weight without reason. MAKE SURE YOU:  Understand these  instructions.  Will watch your condition.  Will get help right away if you are not doing well or get worse. Document Released: 03/25/2001 Document Revised: 09/30/2011 Document Reviewed: 08/10/2011 [facility] Patient Information 2014 [place], [place].  Colposcopy Colposcopy is a procedure to examine your cervix and vagina, or the area around the outside of your vagina, for abnormalities or signs of disease. The procedure is done using a lighted microscope called a colposcope. Tissue samples may be collected during the colposcopy if your health care provider finds any unusual cells. A colposcopy may be done if a woman has:  An abnormal Pap test. A Pap test is a medical test done to evaluate cells that are on the surface of the cervix.  A Pap test result that is suggestive of human papillomavirus (HPV). This virus can cause genital warts and is linked to the development of cervical cancer.  A sore on her cervix and the results of a Pap test were normal.  Genital warts on the cervix or in or around the outside of the vagina.  A mother who took the drug diethylstilbestrol (DES) while pregnant.  Painful intercourse.  Vaginal bleeding, especially after sexual intercourse. LET [facility] IncYOUR HEALTH CARE PROVIDER KNOW ABOUT:  Any allergies you have.  All medicines you are taking, including vitamins, herbs, eye drops, creams, and over-the-counter medicines.  Previous problems you or members of your family have had with the use of anesthetics.  Any blood disorders you have.  Previous surgeries you have had.  Medical conditions you have. RISKS AND COMPLICATIONS Generally, a colposcopy is a safe procedure. However, as with any procedure, complications can occur. Possible complications include:  Bleeding.  Infection.  Missed lesions. BEFORE THE PROCEDURE   Tell your health care provider if you have your menstrual period. A colposcopy typically is not done during menstruation.  For 24 hours  before the colposcopy, do not:  [place].  Use tampons.  Use medicines, creams, or suppositories in the vagina.  Have sexual intercourse. PROCEDURE  During the procedure, you will be lying on your back with your feet in foot rests (stirrups). A warm metal or plastic instrument (speculum) will be placed in your vagina to keep it open and to allow the health care provider to see the cervix. The colposcope will be placed outside the vagina. It will be used to magnify and examine the cervix, vagina, and the area around the outside of the vagina. A small amount of liquid solution will be placed on the area that is to be viewed. This solution will make it easier to see the abnormal cells. Your health care provider will use tools to suck out mucus and cells from the canal of the cervix. Then he or she will record the location of the abnormal areas. If a biopsy is done during the procedure, a medicine will usually be given to numb the area (local anesthetic). You may feel mild pain or cramping while the biopsy is done. After the procedure, tissue samples collected during the biopsy will be sent to a lab for analysis. AFTER  THE PROCEDURE  You will be given instructions on when to follow up with your health care provider for your test results. It is important to keep your appointment. Document Released: 06/21/2002 Document Revised: 12/01/2012 Document Reviewed: 10/28/2012 [facility] Patient Information 2014 [place], [place].

## 2013-06-08 NOTE — Progress Notes (Addendum)
Was originally scheduled for yesterday for colpo, visit, and anatomy u/s, but office was closed d/t snow. U/S and colpo did not get rescheduled. Will work her in for u/s. Reports good fm. Denies uc's, lof, vb, uti s/s.  Sciatica, reviewed relief measures.  Reviewed ptl s/s, fkc.  All questions answered. F/U in 4wks for visit and colpo.

## 2013-06-08 NOTE — Progress Notes (Signed)
U/S(20+5wks)-single active fetus, meas c/w dates, fluid wnl, posterior Gr 0 placenta, cx appears long and closed (4.5 cm), bilateral adnexa wnl, no obvious abnl noted, female fetus although sub-optimal scan, FHR- 144bpm

## 2013-06-09 ENCOUNTER — Other Ambulatory Visit: Payer: Medicaid Other

## 2013-06-28 ENCOUNTER — Encounter: Payer: Self-pay | Admitting: Women's Health

## 2013-07-07 ENCOUNTER — Ambulatory Visit (INDEPENDENT_AMBULATORY_CARE_PROVIDER_SITE_OTHER): Payer: Medicaid Other | Admitting: Obstetrics & Gynecology

## 2013-07-07 ENCOUNTER — Encounter: Payer: Self-pay | Admitting: Obstetrics & Gynecology

## 2013-07-07 VITALS — BP 102/70 | Wt 187.0 lb

## 2013-07-07 DIAGNOSIS — O9989 Other specified diseases and conditions complicating pregnancy, childbirth and the puerperium: Secondary | ICD-10-CM

## 2013-07-07 DIAGNOSIS — O99891 Other specified diseases and conditions complicating pregnancy: Secondary | ICD-10-CM

## 2013-07-07 DIAGNOSIS — Z1389 Encounter for screening for other disorder: Secondary | ICD-10-CM

## 2013-07-07 DIAGNOSIS — Z3400 Encounter for supervision of normal first pregnancy, unspecified trimester: Secondary | ICD-10-CM

## 2013-07-07 DIAGNOSIS — Z331 Pregnant state, incidental: Secondary | ICD-10-CM

## 2013-07-07 DIAGNOSIS — O360990 Maternal care for other rhesus isoimmunization, unspecified trimester, not applicable or unspecified: Secondary | ICD-10-CM

## 2013-07-07 DIAGNOSIS — O98519 Other viral diseases complicating pregnancy, unspecified trimester: Secondary | ICD-10-CM

## 2013-07-07 LAB — POCT URINALYSIS DIPSTICK
Blood, UA: 4
Glucose, UA: NEGATIVE
Ketones, UA: NEGATIVE
Leukocytes, UA: NEGATIVE
Nitrite, UA: NEGATIVE
PROTEIN UA: NEGATIVE

## 2013-07-07 NOTE — Progress Notes (Signed)
Still no colpo done, will schedule next time with her PNII  BP weight and urine results all reviewed and noted. Patient reports good fetal movement, denies any bleeding and no rupture of membranes symptoms or regular contractions. Patient is without complaints. All questions were answered.

## 2013-07-12 IMAGING — CT CT ABD-PELV W/ CM
2 of 4 series · 17 of 46 positions shown, 19 images · IV contrast (APPLIED)
Comparison: Pelvic and lumbar spine radiographs performed
08/11/2007

CLINICAL DATA: Abdominal pain and vomiting.

CT ABDOMEN AND PELVIS WITH CONTRAST
TECHNIQUE: Multidetector CT imaging of the abdomen and pelvis was
performed following the standard protocol during bolus
administration of intravenous contrast.
Contrast: 100mL OMNIPAQUE IOHEXOL 300 MG/ML  SOLN

[Series 2: abd/pelv with 5.0 b31f st · axial · 0.70mm/px · z∈[-670,-250]mm · 14 of 92 slices shown, 16 images]
[im 4/92  soft-tissue]
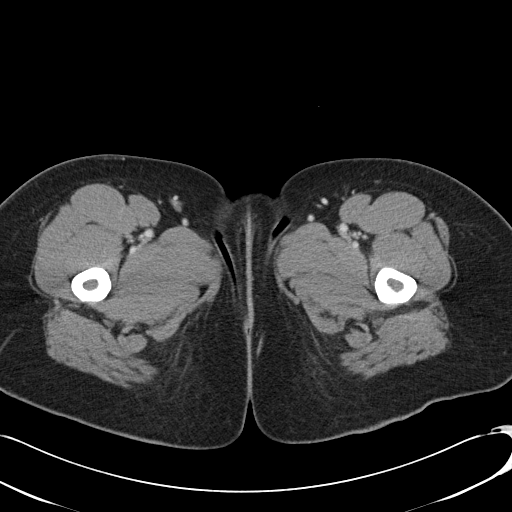
[im 4/92  bone]
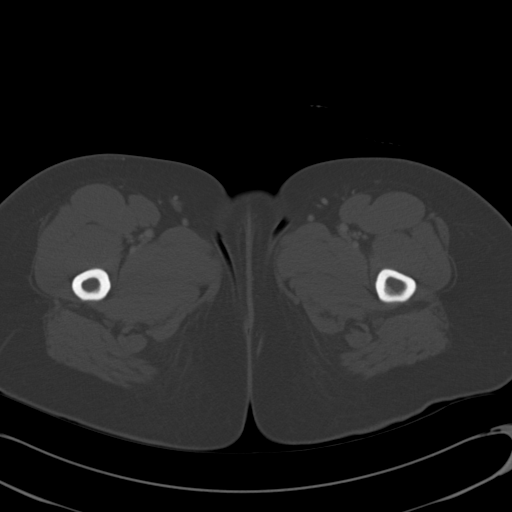
[im 12/92  soft-tissue]
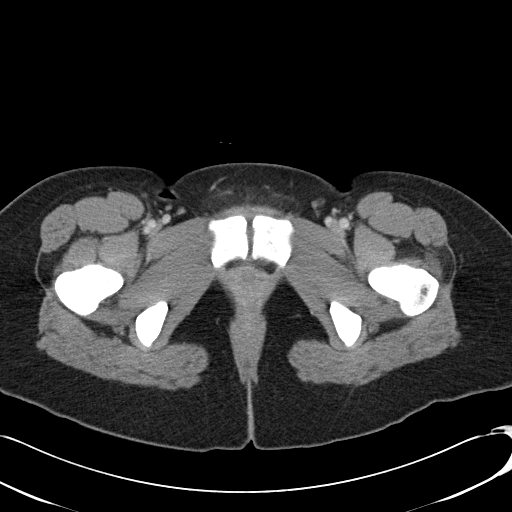
[im 19/92  soft-tissue]
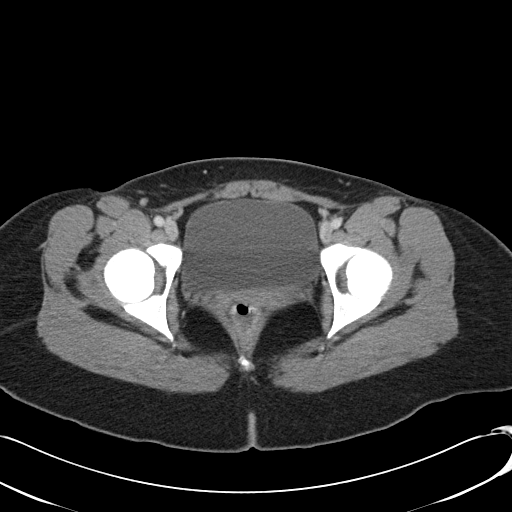
[im 23/92  soft-tissue]
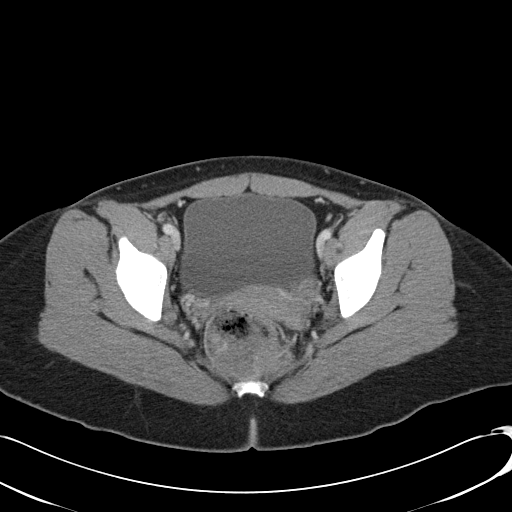
[im 31/92  soft-tissue]
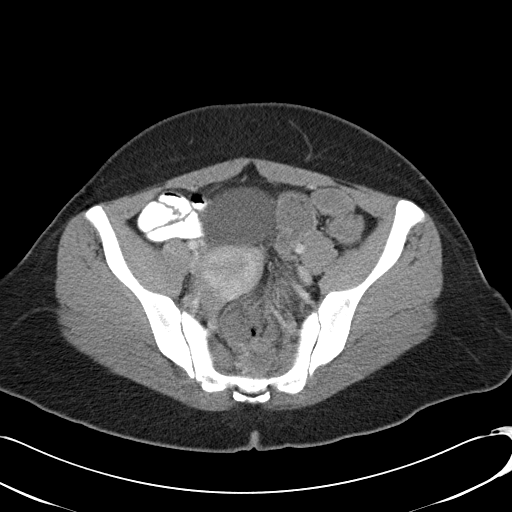
[im 38/92  soft-tissue]
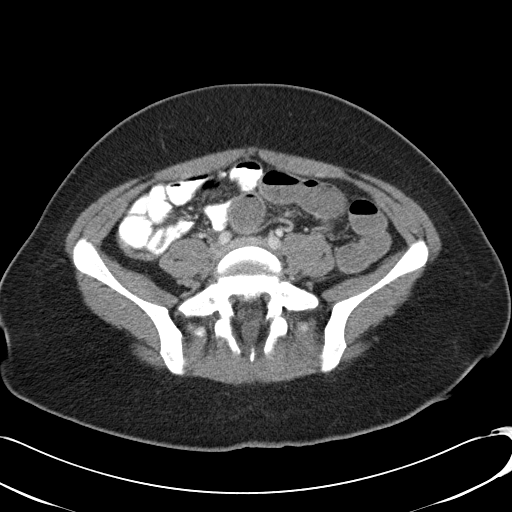
[im 42/92  soft-tissue]
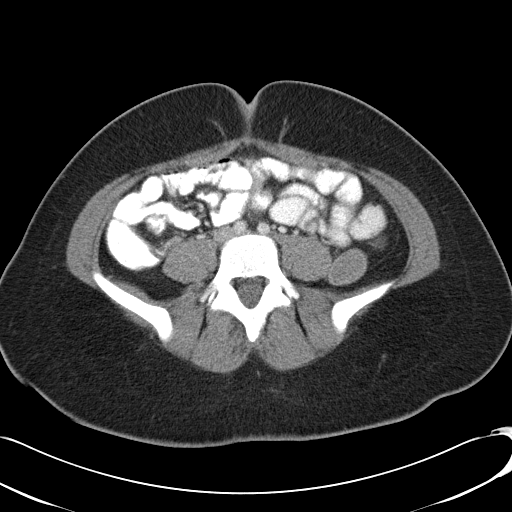
[im 50/92  soft-tissue]
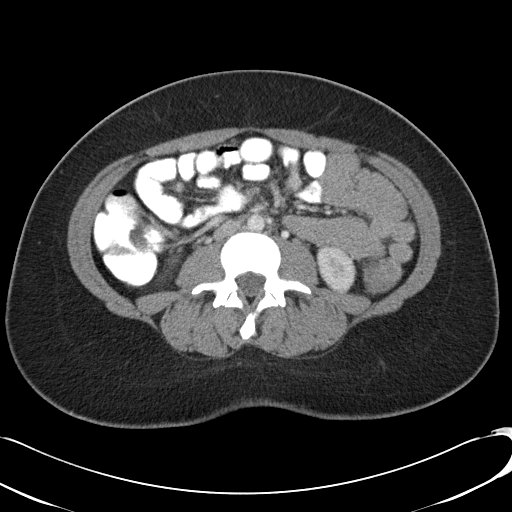
[im 54/92  soft-tissue]
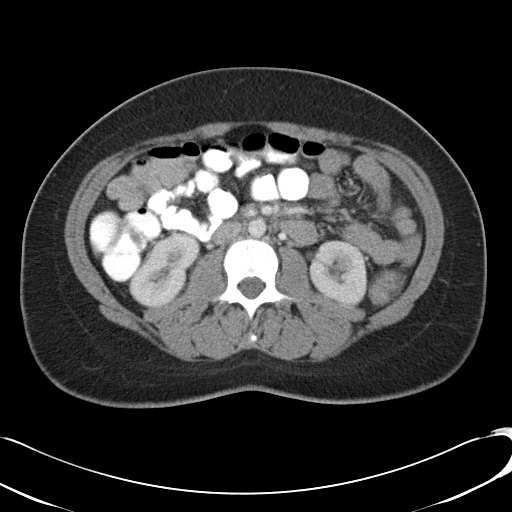
[im 54/92  bone]
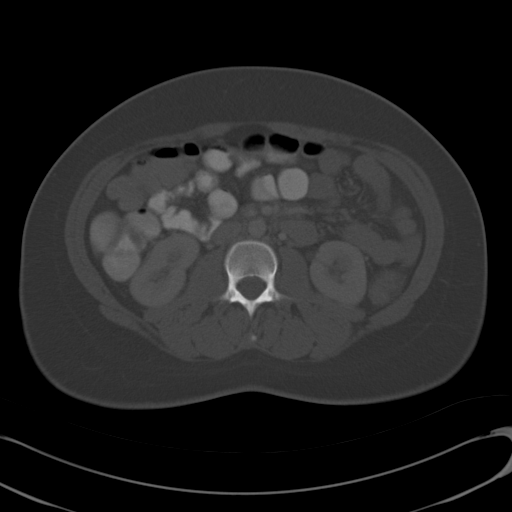
[im 61/92  soft-tissue]
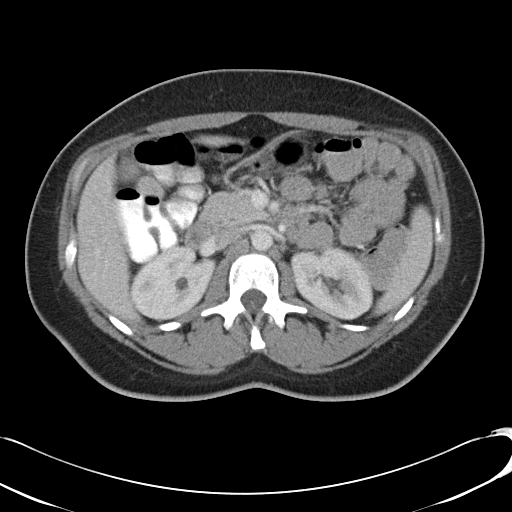
[im 69/92  soft-tissue]
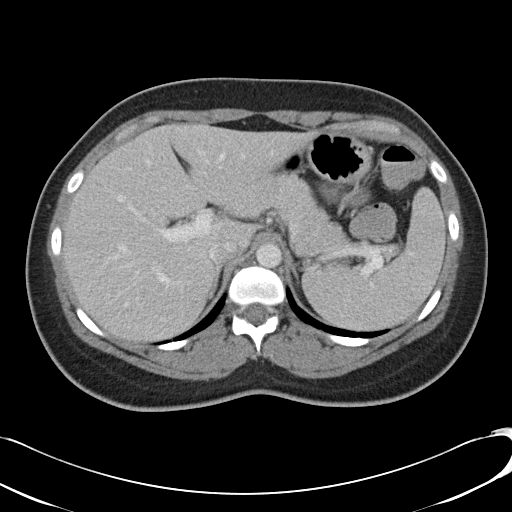
[im 73/92  soft-tissue]
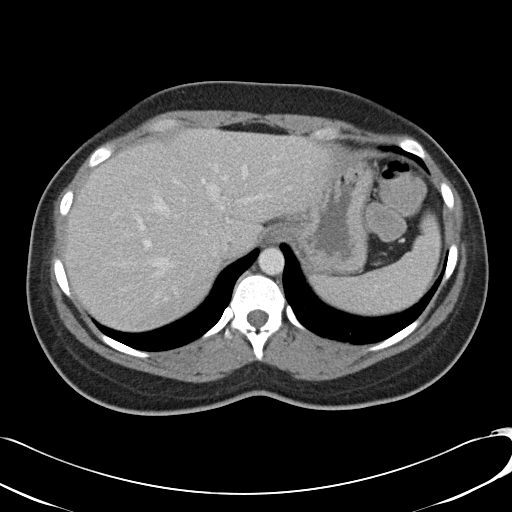
[im 80/92  soft-tissue]
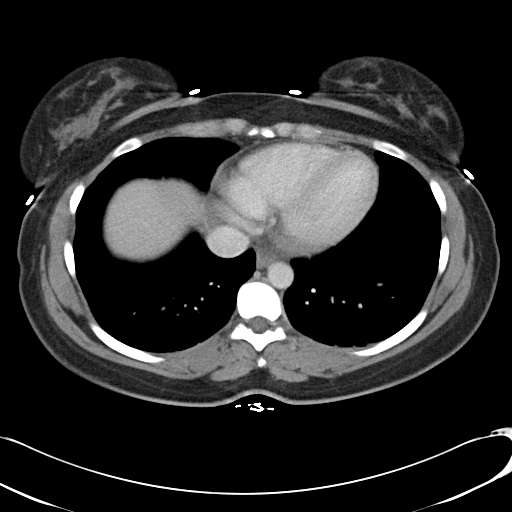
[im 88/92  soft-tissue]
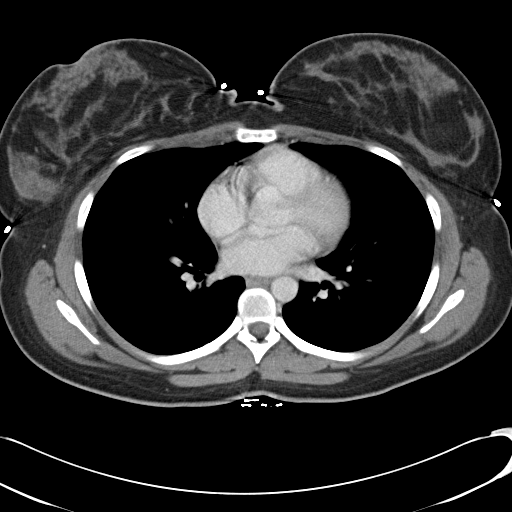

[Series 602: coronals · coronal · 0.90mm/px · 3 of 115 slices shown]
[im 39/115  soft-tissue]
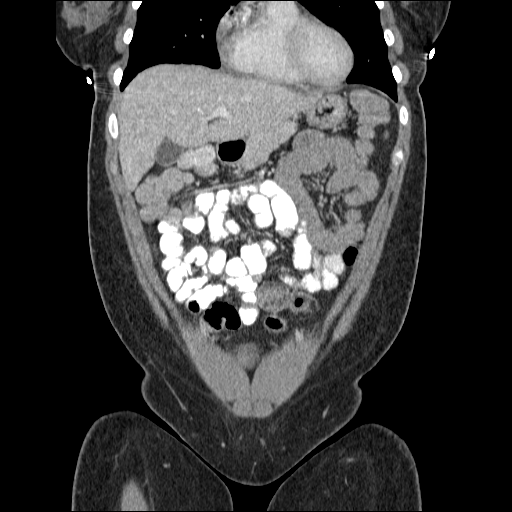
[im 51/115  soft-tissue]
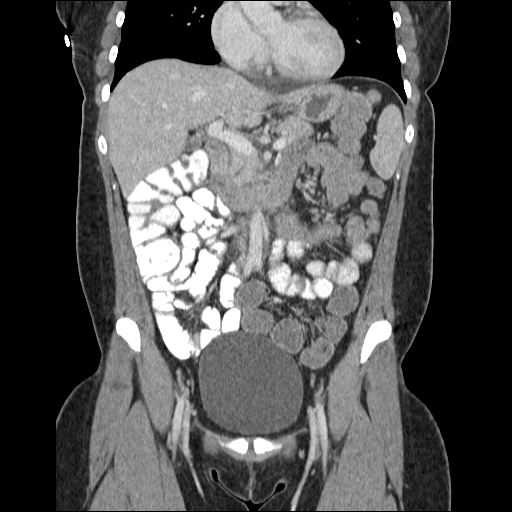
[im 64/115  soft-tissue]
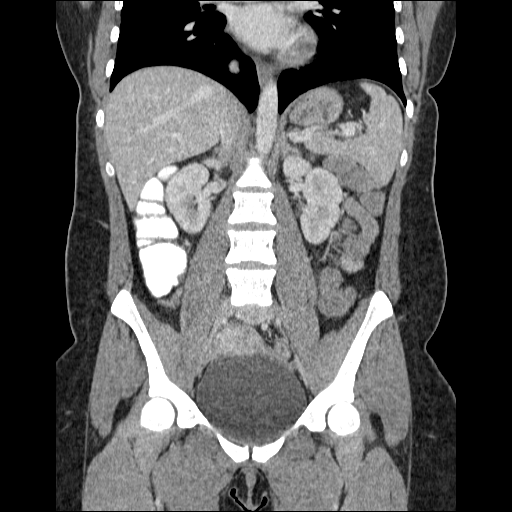

[17 of 46 positions shown; findings below may reference images not displayed]

FINDINGS: The visualized lung bases are clear.

The liver and spleen are unremarkable in appearance.  The
gallbladder is within normal limits.  The pancreas and adrenal
glands are unremarkable.

A few tiny nonobstructing stones are seen at the right kidney,
measuring up to 3 mm in size.  The kidneys are otherwise
unremarkable in appearance.  There is no evidence of
hydronephrosis.  No obstructing ureteral stones are seen.  No
perinephric stranding is appreciated.

No free fluid is identified.  The small bowel is unremarkable in
appearance.  The stomach is within normal limits.  No acute
vascular abnormalities are seen.

The appendix is normal in caliber, without evidence for
appendicitis.  Contrast progresses to the hepatic flexure of the
colon.  The colon is grossly unremarkable in appearance.

The bladder is mildly distended and grossly unremarkable.  The
uterus is within normal limits.  The ovaries are grossly symmetric,
though the left ovary is difficult to fully characterize due to
adjacent bowel.  No suspicious adnexal masses are seen.  No
inguinal lymphadenopathy is seen.

No acute osseous abnormalities are identified.
IMPRESSION: 1.  No acute abnormalities seen within the abdomen or pelvis.
2.  Few tiny nonobstructing stones seen at the right kidney,
measuring up to 3 mm in size.

## 2013-07-19 ENCOUNTER — Telehealth: Payer: Self-pay | Admitting: *Deleted

## 2013-07-19 NOTE — Telephone Encounter (Signed)
Pt states thinks she has an upper respiratory infection, congestions, productive cough, no fever, stuffy nose, wheezing at night only. Pt to push fluids can take OTC mucinex for congestion per OB protocol. Appointment made for tomorrow for pt to be evaluated.

## 2013-07-20 ENCOUNTER — Ambulatory Visit (INDEPENDENT_AMBULATORY_CARE_PROVIDER_SITE_OTHER): Payer: Medicaid Other | Admitting: Adult Health

## 2013-07-20 ENCOUNTER — Encounter: Payer: Self-pay | Admitting: Adult Health

## 2013-07-20 VITALS — BP 122/70 | Temp 98.7°F | Wt 186.5 lb

## 2013-07-20 DIAGNOSIS — O360990 Maternal care for other rhesus isoimmunization, unspecified trimester, not applicable or unspecified: Secondary | ICD-10-CM

## 2013-07-20 DIAGNOSIS — O99891 Other specified diseases and conditions complicating pregnancy: Secondary | ICD-10-CM

## 2013-07-20 DIAGNOSIS — Z1389 Encounter for screening for other disorder: Secondary | ICD-10-CM

## 2013-07-20 DIAGNOSIS — Z331 Pregnant state, incidental: Secondary | ICD-10-CM

## 2013-07-20 DIAGNOSIS — O9989 Other specified diseases and conditions complicating pregnancy, childbirth and the puerperium: Secondary | ICD-10-CM

## 2013-07-20 DIAGNOSIS — J309 Allergic rhinitis, unspecified: Secondary | ICD-10-CM

## 2013-07-20 DIAGNOSIS — J302 Other seasonal allergic rhinitis: Secondary | ICD-10-CM

## 2013-07-20 HISTORY — DX: Other seasonal allergic rhinitis: J30.2

## 2013-07-20 LAB — POCT URINALYSIS DIPSTICK
Glucose, UA: NEGATIVE
KETONES UA: NEGATIVE
Leukocytes, UA: NEGATIVE
Nitrite, UA: NEGATIVE
PROTEIN UA: NEGATIVE

## 2013-07-20 MED ORDER — CETIRIZINE-PSEUDOEPHEDRINE ER 5-120 MG PO TB12: 1.0000 | ORAL_TABLET | Freq: Two times a day (BID) | ORAL | Status: DC

## 2013-07-20 NOTE — Patient Instructions (Signed)

## 2013-07-20 NOTE — Progress Notes (Signed)
Complains of cough, head congestion and sore throat, sneezing and watery eyes, lungs clear, ears clear, throat a little red, and nasal passages pink, no fever,probable allergies will try zyrtec D, any cough drop and saline nose spray.Has GFM FHR 150 by doppler. Follow up as scheduled

## 2013-08-05 ENCOUNTER — Ambulatory Visit (INDEPENDENT_AMBULATORY_CARE_PROVIDER_SITE_OTHER): Payer: Medicaid Other | Admitting: Obstetrics & Gynecology

## 2013-08-05 ENCOUNTER — Other Ambulatory Visit: Payer: Medicaid Other

## 2013-08-05 ENCOUNTER — Encounter: Payer: Self-pay | Admitting: Obstetrics & Gynecology

## 2013-08-05 VITALS — BP 120/90 | Wt 189.0 lb

## 2013-08-05 DIAGNOSIS — R87610 Atypical squamous cells of undetermined significance on cytologic smear of cervix (ASC-US): Secondary | ICD-10-CM

## 2013-08-05 DIAGNOSIS — N879 Dysplasia of cervix uteri, unspecified: Secondary | ICD-10-CM

## 2013-08-05 DIAGNOSIS — Z3400 Encounter for supervision of normal first pregnancy, unspecified trimester: Secondary | ICD-10-CM

## 2013-08-05 DIAGNOSIS — Z1389 Encounter for screening for other disorder: Secondary | ICD-10-CM

## 2013-08-05 DIAGNOSIS — Z331 Pregnant state, incidental: Secondary | ICD-10-CM

## 2013-08-05 DIAGNOSIS — R87810 Cervical high risk human papillomavirus (HPV) DNA test positive: Secondary | ICD-10-CM

## 2013-08-05 LAB — POCT URINALYSIS DIPSTICK
Glucose, UA: NEGATIVE
Ketones, UA: NEGATIVE
Leukocytes, UA: NEGATIVE
Nitrite, UA: NEGATIVE
Protein, UA: NEGATIVE

## 2013-08-05 LAB — CBC
HEMATOCRIT: 33.4 % — AB (ref 36.0–46.0)
HEMOGLOBIN: 11.8 g/dL — AB (ref 12.0–15.0)
MCH: 29.4 pg (ref 26.0–34.0)
MCHC: 35.3 g/dL (ref 30.0–36.0)
MCV: 83.1 fL (ref 78.0–100.0)
Platelets: 189 10*3/uL (ref 150–400)
RBC: 4.02 MIL/uL (ref 3.87–5.11)
RDW: 13.6 % (ref 11.5–15.5)
WBC: 14.1 10*3/uL — ABNORMAL HIGH (ref 4.0–10.5)

## 2013-08-05 NOTE — Progress Notes (Signed)
Pap ASCUS with HR HPV: Colposcopy: Adequate with wide extension of columanr epithelium onto the cervical portio +acetowhite changes No punctation No mosaicism No abnormal vessels Imp HPV atypia, repeat colposopy 6-8 weeks after delivery  BP weight and urine results all reviewed and noted. Patient reports good fetal movement, denies any bleeding and no rupture of membranes symptoms or regular contractions. Patient is without complaints. All questions were answered.

## 2013-08-06 LAB — GLUCOSE TOLERANCE, 2 HOURS W/ 1HR
GLUCOSE, 2 HOUR: 103 mg/dL (ref 70–139)
GLUCOSE: 122 mg/dL (ref 70–170)
Glucose, Fasting: 85 mg/dL (ref 70–99)

## 2013-08-06 LAB — HIV ANTIBODY (ROUTINE TESTING W REFLEX): HIV 1&2 Ab, 4th Generation: NONREACTIVE

## 2013-08-06 LAB — RPR

## 2013-08-06 LAB — ANTIBODY SCREEN: ANTIBODY SCREEN: NEGATIVE

## 2013-08-08 LAB — HSV 2 ANTIBODY, IGG

## 2013-08-21 ENCOUNTER — Encounter: Payer: Self-pay | Admitting: Obstetrics & Gynecology

## 2013-08-26 ENCOUNTER — Ambulatory Visit (INDEPENDENT_AMBULATORY_CARE_PROVIDER_SITE_OTHER): Payer: Medicaid Other | Admitting: Obstetrics & Gynecology

## 2013-08-26 VITALS — BP 108/70 | Wt 193.0 lb

## 2013-08-26 DIAGNOSIS — Z6791 Unspecified blood type, Rh negative: Secondary | ICD-10-CM

## 2013-08-26 DIAGNOSIS — O360990 Maternal care for other rhesus isoimmunization, unspecified trimester, not applicable or unspecified: Secondary | ICD-10-CM

## 2013-08-26 DIAGNOSIS — Z331 Pregnant state, incidental: Secondary | ICD-10-CM

## 2013-08-26 DIAGNOSIS — Z1389 Encounter for screening for other disorder: Secondary | ICD-10-CM

## 2013-08-26 LAB — POCT URINALYSIS DIPSTICK
Glucose, UA: NEGATIVE
Ketones, UA: NEGATIVE
Leukocytes, UA: NEGATIVE
Nitrite, UA: NEGATIVE
PROTEIN UA: NEGATIVE

## 2013-08-26 MED ORDER — RHO D IMMUNE GLOBULIN 1500 UNIT/2ML IJ SOSY
300.0000 ug | PREFILLED_SYRINGE | Freq: Once | INTRAMUSCULAR | Status: AC
  Administered 2013-08-26: 300 ug via INTRAMUSCULAR

## 2013-08-26 NOTE — Progress Notes (Signed)
BP weight and urine results all reviewed and noted. Patient reports good fetal movement, denies any bleeding and no rupture of membranes symptoms or regular contractions. Patient is without complaints. All questions were answered.  

## 2013-08-26 NOTE — Addendum Note (Signed)
Addended by: Criss AlvinePULLIAM, Halaina Vanduzer G on: 08/26/2013 10:31 AM   Modules accepted: Orders

## 2013-08-31 ENCOUNTER — Encounter: Payer: Self-pay | Admitting: Obstetrics & Gynecology

## 2013-09-09 ENCOUNTER — Encounter: Payer: Self-pay | Admitting: Obstetrics & Gynecology

## 2013-09-09 ENCOUNTER — Ambulatory Visit (INDEPENDENT_AMBULATORY_CARE_PROVIDER_SITE_OTHER): Payer: Self-pay | Admitting: Obstetrics & Gynecology

## 2013-09-09 VITALS — BP 120/70 | Wt 195.0 lb

## 2013-09-09 DIAGNOSIS — Z331 Pregnant state, incidental: Secondary | ICD-10-CM

## 2013-09-09 DIAGNOSIS — Z1389 Encounter for screening for other disorder: Secondary | ICD-10-CM

## 2013-09-09 DIAGNOSIS — IMO0002 Reserved for concepts with insufficient information to code with codable children: Secondary | ICD-10-CM

## 2013-09-09 DIAGNOSIS — O3660X0 Maternal care for excessive fetal growth, unspecified trimester, not applicable or unspecified: Secondary | ICD-10-CM

## 2013-09-09 LAB — POCT URINALYSIS DIPSTICK
Glucose, UA: NEGATIVE
KETONES UA: NEGATIVE
Nitrite, UA: NEGATIVE
Protein, UA: NEGATIVE

## 2013-09-09 NOTE — Progress Notes (Signed)
Measruring a little big, could be because pt is height challenged, will check sonogram EFW %tile next visit  G1P0 [redacted]w[redacted]d Estimated Date of Delivery: 10/21/13   BP weight and urine results all reviewed and noted. Patient reports good fetal movement, denies any bleeding and no rupture of membranes symptoms or regular contractions.  Patient is without complaints. All questions were answered.  Follow up in 2 weeks for routine OB appt + sonogram

## 2013-09-20 ENCOUNTER — Encounter: Payer: Self-pay | Admitting: Obstetrics & Gynecology

## 2013-09-20 NOTE — Telephone Encounter (Signed)
Spoke with patient, she reports good FM, no bleeding or loss of fluids.  Advised her to push fluids, rest when possible and if cramps get worse or closer together, has a sudden gush of fluids, has any vaginal bleeding or if feels like baby is not moving as much as normal to give [place] a call back.  Pt verbalized understanding.

## 2013-09-20 NOTE — Telephone Encounter (Signed)
[id number]

## 2013-09-21 ENCOUNTER — Inpatient Hospital Stay (HOSPITAL_COMMUNITY)
Admission: AD | Admit: 2013-09-21 | Discharge: 2013-09-21 | Disposition: A | Discharge status: Home / Self Care | Payer: Medicaid Other | Source: Ambulatory Visit | Attending: Obstetrics & Gynecology | Admitting: Obstetrics & Gynecology

## 2013-09-21 ENCOUNTER — Encounter (HOSPITAL_COMMUNITY): Payer: Self-pay | Admitting: *Deleted

## 2013-09-21 ENCOUNTER — Telehealth: Payer: Self-pay | Admitting: *Deleted

## 2013-09-21 DIAGNOSIS — O4700 False labor before 37 completed weeks of gestation, unspecified trimester: Secondary | ICD-10-CM | POA: Insufficient documentation

## 2013-09-21 DIAGNOSIS — Z6791 Unspecified blood type, Rh negative: Secondary | ICD-10-CM

## 2013-09-21 DIAGNOSIS — Z283 Underimmunization status: Principal | ICD-10-CM

## 2013-09-21 DIAGNOSIS — Z8249 Family history of ischemic heart disease and other diseases of the circulatory system: Secondary | ICD-10-CM | POA: Insufficient documentation

## 2013-09-21 DIAGNOSIS — N898 Other specified noninflammatory disorders of vagina: Secondary | ICD-10-CM

## 2013-09-21 DIAGNOSIS — N189 Chronic kidney disease, unspecified: Secondary | ICD-10-CM | POA: Insufficient documentation

## 2013-09-21 DIAGNOSIS — Z3400 Encounter for supervision of normal first pregnancy, unspecified trimester: Secondary | ICD-10-CM

## 2013-09-21 DIAGNOSIS — O26899 Other specified pregnancy related conditions, unspecified trimester: Secondary | ICD-10-CM

## 2013-09-21 DIAGNOSIS — Z87891 Personal history of nicotine dependence: Secondary | ICD-10-CM | POA: Insufficient documentation

## 2013-09-21 DIAGNOSIS — O09899 Supervision of other high risk pregnancies, unspecified trimester: Secondary | ICD-10-CM

## 2013-09-21 DIAGNOSIS — Z789 Other specified health status: Secondary | ICD-10-CM

## 2013-09-21 DIAGNOSIS — Z2839 Other underimmunization status: Secondary | ICD-10-CM

## 2013-09-21 DIAGNOSIS — O26893 Other specified pregnancy related conditions, third trimester: Secondary | ICD-10-CM

## 2013-09-21 DIAGNOSIS — O26839 Pregnancy related renal disease, unspecified trimester: Secondary | ICD-10-CM | POA: Insufficient documentation

## 2013-09-21 HISTORY — DX: Calculus of kidney: N20.0

## 2013-09-21 LAB — URINALYSIS, ROUTINE W REFLEX MICROSCOPIC
BILIRUBIN URINE: NEGATIVE
Glucose, UA: NEGATIVE mg/dL
Ketones, ur: >80 mg/dL — AB
Nitrite: NEGATIVE
PH: 8.5 — AB (ref 5.0–8.0)
Protein, ur: NEGATIVE mg/dL
SPECIFIC GRAVITY, URINE: 1.015 (ref 1.005–1.030)
Urobilinogen, UA: 0.2 mg/dL (ref 0.0–1.0)

## 2013-09-21 LAB — URINE MICROSCOPIC-ADD ON

## 2013-09-21 LAB — AMNISURE RUPTURE OF MEMBRANE (ROM) NOT AT ARMC: Amnisure ROM: NEGATIVE

## 2013-09-21 NOTE — MAU Provider Note (Signed)
History     CSN: [id number]  Arrival date and time: 09/21/13 1229   First Provider Initiated Contact with Patient 09/21/13 1332      Chief Complaint  Patient presents with  . Labor Eval   HPI Comments: Jodi King is a 23 yo female G2P0010 at 35 weeks 5 days who presents to MAU with ctxs that started last night and continued today every 20-30 mins lasting 15-20 seconds.  Patient states "her water broke" this morning after she saw a large amount of clear vaginal discharge.  Patients admits to intermittent pelvic cramping but no treatments have been attempted. Patient states she vomited this morning x 1 at 0100 am, but has since been fine.  Patient confirms good fetal movement. Patient denies hematuria, dysuria, urgency, or frequency.  Denies fever, chills, or maliase.    Pt states that she had initially significant output, but now has decreased and is not using a pad or other absorptive measure.  Past Medical History  Diagnosis Date  . Chronic kidney disease     kidney stones  . Miscarriage   . Seasonal allergies 07/20/2013  . Kidney stones     Past Surgical History  Procedure Laterality Date  . Fracture surgery    . Femur fracture surgery      Family History  Problem Relation Age of Onset  . Hypertension Mother   . Nephrolithiasis Mother     History  Substance Use Topics  . Smoking status: Former Smoker    Types: Cigarettes    Quit date: 08/09/2011  . Smokeless tobacco: Never Used  . Alcohol Use: No     Comment: not now    Allergies: No Known Allergies  Prescriptions prior to admission  Medication Sig Dispense Refill  . acetaminophen (TYLENOL) 325 MG tablet Take 325 mg by mouth every 6 (six) hours as needed for moderate pain.      . Prenatal Multivit-Min-Fe-FA (PRENATAL VITAMINS PO) Take 1 tablet by mouth daily.      Gerarda Fraction. Valerian EXTR Take 1 tablet by mouth as needed.        Review of Systems  Constitutional: Negative for fever, chills and malaise/fatigue.   Respiratory: Negative.   Cardiovascular: Negative.   Gastrointestinal: Positive for nausea and vomiting. Negative for abdominal pain, diarrhea and constipation.  Neurological: Negative for dizziness and headaches.   Physical Exam   Blood pressure 121/73, pulse 104, temperature 98.9 F (37.2 C), temperature source Oral, resp. rate 18, height 4' 11.5" (1.511 m), weight 87.907 kg (193 lb 12.8 oz), last menstrual period 01/14/2013, SpO2 97.00%.  Physical Exam  Constitutional: She is oriented to person, place, and time. She appears well-developed and well-nourished.  HENT:  Head: Normocephalic and atraumatic.  Cardiovascular: Normal rate, regular rhythm and normal heart sounds.   Respiratory: Effort normal and breath sounds normal.  GI: Soft. Bowel sounds are normal. She exhibits no distension. There is no tenderness. There is no rebound.  Genitourinary: Vagina normal and uterus normal. There is no tenderness on the right labia. There is no tenderness on the left labia. Cervix exhibits no motion tenderness. Right adnexum displays no tenderness. Left adnexum displays no tenderness. No vaginal discharge found.  No fluid pooling upon pelvic exam  Neurological: She is alert and oriented to person, place, and time.  Skin: Skin is warm and dry.   Minimal white discharge with mucous, no pooling  Cervix- soft and long   2/20/-3 CXT- irregular 4-6 min (patient unaware of her frequent  ctx at the time) non painful FHR- 130 bpm; moderate variability w/ accels present and no decels; Cat I Fern Test- Negative AmniSure- Negative    Results for orders placed during the hospital encounter of 09/21/13 (from the past 24 hour(s))  URINALYSIS, ROUTINE W REFLEX MICROSCOPIC     Status: Abnormal   Collection Time    09/21/13 12:50 PM      Result Value Ref Range   Color, Urine YELLOW  YELLOW   APPearance HAZY (*) CLEAR   Specific Gravity, Urine 1.015  1.005 - 1.030   pH 8.5 (*) 5.0 - 8.0   Glucose, UA  NEGATIVE  NEGATIVE mg/dL   Hgb urine dipstick MODERATE (*) NEGATIVE   Bilirubin Urine NEGATIVE  NEGATIVE   Ketones, ur >80 (*) NEGATIVE mg/dL   Protein, ur NEGATIVE  NEGATIVE mg/dL   Urobilinogen, UA 0.2  0.0 - 1.0 mg/dL   Nitrite NEGATIVE  NEGATIVE   Leukocytes, UA TRACE (*) NEGATIVE  URINE MICROSCOPIC-ADD ON     Status: Abnormal   Collection Time    09/21/13 12:50 PM      Result Value Ref Range   Squamous Epithelial / LPF MANY (*) RARE   WBC, UA 0-2  <3 WBC/hpf   RBC / HPF 7-10  <3 RBC/hpf   Bacteria, UA MANY (*) RARE    MAU Course  Procedures  MDM Neg pooling, neg ferning, neg amnisure  Assessment and Plan  Reasurance provided. Return precautions reviewed. Pt discharged home to follow up with [facility] Cat I Antis, Carson L 09/21/2013, 1:51 PM   I spoke with and examined patient and agree with PA-S's note and plan of care.  Tawana Scale, MD Ob Fellow 09/21/2013 2:43 PM

## 2013-09-21 NOTE — Discharge Instructions (Signed)
Third Trimester of Pregnancy  The third trimester is from week 29 through week 42, months 7 through 9. The third trimester is a time when the fetus is growing rapidly. At the end of the ninth month, the fetus is about 20 inches in length and weighs 6 10 pounds.   BODY CHANGES  Your body goes through many changes during pregnancy. The changes vary from woman to woman.    Your weight will continue to increase. You can expect to gain 25 35 pounds (11 16 kg) by the end of the pregnancy.   You may begin to get stretch marks on your hips, abdomen, and breasts.   You may urinate more often because the fetus is moving lower into your pelvis and pressing on your bladder.   You may develop or continue to have heartburn as a result of your pregnancy.   You may develop constipation because certain hormones are causing the muscles that push waste through your intestines to slow down.   You may develop hemorrhoids or swollen, bulging veins (varicose veins).   You may have pelvic pain because of the weight gain and pregnancy hormones relaxing your joints between the bones in your pelvis. Back aches may result from over exertion of the muscles supporting your posture.   Your breasts will continue to grow and be tender. A yellow discharge may leak from your breasts called colostrum.   Your belly button may stick out.   You may feel short of breath because of your expanding uterus.   You may notice the fetus "dropping," or moving lower in your abdomen.   You may have a bloody mucus discharge. This usually occurs a few days to a week before labor begins.   Your cervix becomes thin and soft (effaced) near your due date.  WHAT TO EXPECT AT YOUR PRENATAL EXAMS   You will have prenatal exams every 2 weeks until week 36. Then, you will have weekly prenatal exams. During a routine prenatal visit:   You will be weighed to make sure you and the fetus are growing normally.   Your blood pressure is taken.   Your abdomen will be  measured to track your baby's growth.   The fetal heartbeat will be listened to.   Any test results from the previous visit will be discussed.   You may have a cervical check near your due date to see if you have effaced.  At around 36 weeks, your caregiver will check your cervix. At the same time, your caregiver will also perform a test on the secretions of the vaginal tissue. This test is to determine if a type of bacteria, Group B streptococcus, is present. Your caregiver will explain this further.  Your caregiver may ask you:   What your birth plan is.   How you are feeling.   If you are feeling the baby move.   If you have had any abnormal symptoms, such as leaking fluid, bleeding, severe headaches, or abdominal cramping.   If you have any questions.  Other tests or screenings that may be performed during your third trimester include:   Blood tests that check for low iron levels (anemia).   Fetal testing to check the health, activity level, and growth of the fetus. Testing is done if you have certain medical conditions or if there are problems during the pregnancy.  FALSE LABOR  You may feel small, irregular contractions that eventually go away. These are called Braxton Hicks contractions, or   false labor. Contractions may last for hours, days, or even weeks before true labor sets in. If contractions come at regular intervals, intensify, or become painful, it is best to be seen by your caregiver.   SIGNS OF LABOR    Menstrual-like cramps.   Contractions that are 5 minutes apart or less.   Contractions that start on the top of the uterus and spread down to the lower abdomen and back.   A sense of increased pelvic pressure or back pain.   A watery or bloody mucus discharge that comes from the vagina.  If you have any of these signs before the 37th week of pregnancy, call your caregiver right away. You need to go to the hospital to get checked immediately.  HOME CARE INSTRUCTIONS    Avoid all  smoking, herbs, alcohol, and unprescribed drugs. These chemicals affect the formation and growth of the baby.   Follow your caregiver's instructions regarding medicine use. There are medicines that are either safe or unsafe to take during pregnancy.   Exercise only as directed by your caregiver. Experiencing uterine cramps is a good sign to stop exercising.   Continue to eat regular, healthy meals.   Wear a good support bra for breast tenderness.   Do not use hot tubs, steam rooms, or saunas.   Wear your seat belt at all times when driving.   Avoid raw meat, uncooked cheese, cat litter boxes, and soil used by cats. These carry germs that can cause birth defects in the baby.   Take your prenatal vitamins.   Try taking a stool softener (if your caregiver approves) if you develop constipation. Eat more high-fiber foods, such as fresh vegetables or fruit and whole grains. Drink plenty of fluids to keep your urine clear or pale yellow.   Take warm sitz baths to soothe any pain or discomfort caused by hemorrhoids. Use hemorrhoid cream if your caregiver approves.   If you develop varicose veins, wear support hose. Elevate your feet for 15 minutes, 3 4 times a day. Limit salt in your diet.   Avoid heavy lifting, wear low heal shoes, and practice good posture.   Rest a lot with your legs elevated if you have leg cramps or low back pain.   Visit your dentist if you have not gone during your pregnancy. Use a soft toothbrush to brush your teeth and be gentle when you floss.   A sexual relationship may be continued unless your caregiver directs you otherwise.   Do not travel far distances unless it is absolutely necessary and only with the approval of your caregiver.   Take prenatal classes to understand, practice, and ask questions about the labor and delivery.   Make a trial run to the hospital.   Pack your hospital bag.   Prepare the baby's nursery.   Continue to go to all your prenatal visits as directed  by your caregiver.  SEEK MEDICAL CARE IF:   You are unsure if you are in labor or if your water has broken.   You have dizziness.   You have mild pelvic cramps, pelvic pressure, or nagging pain in your abdominal area.   You have persistent nausea, vomiting, or diarrhea.   You have a bad smelling vaginal discharge.   You have pain with urination.  SEEK IMMEDIATE MEDICAL CARE IF:    You have a fever.   You are leaking fluid from your vagina.   You have spotting or bleeding from your vagina.     You have severe abdominal cramping or pain.   You have rapid weight loss or gain.   You have shortness of breath with chest pain.   You notice sudden or extreme swelling of your face, hands, ankles, feet, or legs.   You have not felt your baby move in over an hour.   You have severe headaches that do not go away with medicine.   You have vision changes.  Document Released: 03/25/2001 Document Revised: 12/01/2012 Document Reviewed: 06/01/2012  [employer] Patient Information 2014 [employer].

## 2013-09-21 NOTE — Telephone Encounter (Signed)
Pt states that she that she was sitting on the side of the bed and a small gush of fluid came out , clear with no odor. Pt states that she is having contractions that are from 15- 20 minutes apart. Pt denies any bleeding. Pt states that she has good baby movement and has had a little pressure down there today. I advised the pt to go to [facility] for evaluation, pt verbalized understanding.

## 2013-09-21 NOTE — MAU Note (Signed)
Patient states she had leaking of clear fluid at 1030 this am but has only a discharge now, not wearing a pad. States contractions every 10-15 minutes. Reports good fetal movement.

## 2013-09-26 ENCOUNTER — Ambulatory Visit (INDEPENDENT_AMBULATORY_CARE_PROVIDER_SITE_OTHER): Payer: Medicaid Other

## 2013-09-26 ENCOUNTER — Other Ambulatory Visit: Payer: Self-pay | Admitting: Obstetrics & Gynecology

## 2013-09-26 ENCOUNTER — Encounter: Payer: Self-pay | Admitting: Women's Health

## 2013-09-26 ENCOUNTER — Ambulatory Visit (INDEPENDENT_AMBULATORY_CARE_PROVIDER_SITE_OTHER): Payer: Self-pay | Admitting: Women's Health

## 2013-09-26 VITALS — BP 110/62 | Wt 195.0 lb

## 2013-09-26 DIAGNOSIS — IMO0002 Reserved for concepts with insufficient information to code with codable children: Secondary | ICD-10-CM

## 2013-09-26 DIAGNOSIS — Z331 Pregnant state, incidental: Secondary | ICD-10-CM

## 2013-09-26 DIAGNOSIS — Z3400 Encounter for supervision of normal first pregnancy, unspecified trimester: Secondary | ICD-10-CM

## 2013-09-26 DIAGNOSIS — O3660X0 Maternal care for excessive fetal growth, unspecified trimester, not applicable or unspecified: Secondary | ICD-10-CM

## 2013-09-26 DIAGNOSIS — Z1389 Encounter for screening for other disorder: Secondary | ICD-10-CM

## 2013-09-26 LAB — POCT URINALYSIS DIPSTICK
Glucose, UA: NEGATIVE
KETONES UA: NEGATIVE
Leukocytes, UA: NEGATIVE
Nitrite, UA: NEGATIVE
Protein, UA: NEGATIVE
RBC UA: NEGATIVE

## 2013-09-26 LAB — OB RESULTS CONSOLE GC/CHLAMYDIA
Chlamydia: NEGATIVE
GC PROBE AMP, GENITAL: NEGATIVE

## 2013-09-26 NOTE — Progress Notes (Signed)
U/S(36+3wks)-vtx active fetus, FHR- 155bpm, fluid WNL AFI-12.4cm, posterior/fundal Gr 1 placenta, EFW 5 lb 13 oz (26th%tile), female fetus "Jodi King"

## 2013-09-26 NOTE — Patient Instructions (Signed)
[place] Pediatricians:  [facility] [phone]            [facility] [phone]                 [facility] [phone] (usually doesn't accept new patients unless you have family there already, you are always welcome to call and ask)             [facility] ([address] [place]) [phone]   [facility]:   [facility]: [phone]  [facility]: [phone]  Preterm Labor Information Preterm labor is when labor starts at less than 37 weeks of pregnancy. The normal length of a pregnancy is 39 to 41 weeks. CAUSES Often, there is no identifiable underlying cause as to why a woman goes into preterm labor. One of the most common known causes of preterm labor is infection. Infections of the uterus, cervix, vagina, amniotic sac, bladder, kidney, or even the lungs (pneumonia) can cause labor to start. Other suspected causes of preterm labor include:   Urogenital infections, such as yeast infections and bacterial vaginosis.   Uterine abnormalities (uterine shape, uterine septum, fibroids, or bleeding from the placenta).   A cervix that has been operated on (it may fail to stay closed).   Malformations in the fetus.   Multiple gestations (twins, triplets, and so on).   Breakage of the amniotic sac.  RISK FACTORS  Having a previous history of preterm labor.   Having premature rupture of membranes (PROM).   Having a placenta that covers the opening of the cervix (placenta previa).   Having a placenta that separates from the uterus (placental abruption).   Having a cervix that is too weak to hold the fetus in the uterus (incompetent cervix).   Having too much fluid in the amniotic sac (polyhydramnios).   Taking illegal drugs or smoking while pregnant.   Not gaining enough weight while pregnant.   Being younger than 2218 and older than 22 years  old.   Having a low socioeconomic status.   Being African American. SYMPTOMS Signs and symptoms of preterm labor include:   Menstrual-like cramps, abdominal pain, or back pain.  Uterine contractions that are regular, as frequent as six in an hour, regardless of their intensity (may be mild or painful).  Contractions that start on the top of the uterus and spread down to the lower abdomen and back.   A sense of increased pelvic pressure.   A watery or bloody mucus discharge that comes from the vagina.  TREATMENT Depending on the length of the pregnancy and other circumstances, your health care provider may suggest bed rest. If necessary, there are medicines that can be given to stop contractions and to mature the fetal lungs. If labor happens before 34 weeks of pregnancy, a prolonged hospital stay may be recommended. Treatment depends on the condition of both you and the fetus.  WHAT SHOULD YOU DO IF YOU THINK YOU ARE IN PRETERM LABOR? Call your health care provider right away. You will need to go to the hospital to get checked immediately. HOW CAN YOU PREVENT PRETERM LABOR IN FUTURE PREGNANCIES? You should:   Stop smoking if you smoke.  Maintain healthy weight gain and avoid chemicals and drugs that are not necessary.  Be watchful for any type of infection.  Inform your health care provider if you have a known history of preterm labor. Document Released: 06/21/2003 Document Revised: 12/01/2012 Document Reviewed: 05/03/2012  05/03/2012 Exit[employer] Patient Information 2014 [employer].  

## 2013-09-26 NOTE — Progress Notes (Signed)
Low-risk OB appointment [id number] 9821w3d Estimated Date of Delivery: 10/21/13 Blood pressure 110/62, weight 195 lb (88.451 kg), last menstrual period 01/14/2013.  BP, weight, and urine results all reviewed and noted.  Please refer to the obstetrical flow sheet for the fundal height and fetal heart rate documentation. Reports good fm.  Denies lof, vb, or uti s/s. Regular uc's q 7030min-1hr x few days. [facility]- was 2cm, no rom and d/c'd. SVE: 1.5/50/-2, vtx, soft GBS collected today  Reviewed ptl s/s, fkc. Plan:  Continued routine obstetrical care  F/U in 1wk for OB appointment

## 2013-09-26 NOTE — Addendum Note (Signed)
Addended by: Gaylyn RongEVANS, Ragna Kramlich A on: 09/26/2013 11:35 AM   Modules accepted: Orders

## 2013-09-27 ENCOUNTER — Encounter: Payer: Self-pay | Admitting: Women's Health

## 2013-09-27 LAB — GC/CHLAMYDIA PROBE AMP
CT PROBE, AMP APTIMA: NEGATIVE
GC PROBE AMP APTIMA: NEGATIVE

## 2013-09-28 ENCOUNTER — Encounter: Payer: Self-pay | Admitting: Women's Health

## 2013-09-28 LAB — STREP B DNA PROBE: GBSP: NOT DETECTED

## 2013-10-01 ENCOUNTER — Inpatient Hospital Stay (HOSPITAL_COMMUNITY)
Admission: AD | Admit: 2013-10-01 | Discharge: 2013-10-01 | Disposition: A | Discharge status: Home / Self Care | Payer: Medicaid Other | Source: Ambulatory Visit | Attending: Obstetrics and Gynecology | Admitting: Obstetrics and Gynecology

## 2013-10-01 ENCOUNTER — Encounter (HOSPITAL_COMMUNITY): Payer: Self-pay

## 2013-10-01 DIAGNOSIS — O09899 Supervision of other high risk pregnancies, unspecified trimester: Secondary | ICD-10-CM

## 2013-10-01 DIAGNOSIS — O479 False labor, unspecified: Secondary | ICD-10-CM | POA: Insufficient documentation

## 2013-10-01 DIAGNOSIS — Z283 Underimmunization status: Secondary | ICD-10-CM

## 2013-10-01 NOTE — Discharge Instructions (Signed)
Third Trimester of Pregnancy The third trimester is from week 29 through week 42, months 7 through 9. The third trimester is a time when the fetus is growing rapidly. At the end of the ninth month, the fetus is about 20 inches in length and weighs 6-10 pounds.  BODY CHANGES Your body goes through many changes during pregnancy. The changes vary from woman to woman.   Your weight will continue to increase. You can expect to gain 25-35 pounds (11-16 kg) by the end of the pregnancy.  You may begin to get stretch marks on your hips, abdomen, and breasts.  You may urinate more often because the fetus is moving lower into your pelvis and pressing on your bladder.  You may develop or continue to have heartburn as a result of your pregnancy.  You may develop constipation because certain hormones are causing the muscles that push waste through your intestines to slow down.  You may develop hemorrhoids or swollen, bulging veins (varicose veins).  You may have pelvic pain because of the weight gain and pregnancy hormones relaxing your joints between the bones in your pelvis. Backaches may result from overexertion of the muscles supporting your posture.  You may have changes in your hair. These can include thickening of your hair, rapid growth, and changes in texture. Some women also have hair loss during or after pregnancy, or hair that feels dry or thin. Your hair will most likely return to normal after your baby is born.  Your breasts will continue to grow and be tender. A yellow discharge may leak from your breasts called colostrum.  Your belly button may stick out.  You may feel short of breath because of your expanding uterus.  You may notice the fetus "dropping," or moving lower in your abdomen.  You may have a bloody mucus discharge. This usually occurs a few days to a week before labor begins.  Your cervix becomes thin and soft (effaced) near your due date. WHAT TO EXPECT AT YOUR PRENATAL  EXAMS  You will have prenatal exams every 2 weeks until week 36. Then, you will have weekly prenatal exams. During a routine prenatal visit:  You will be weighed to make sure you and the fetus are growing normally.  Your blood pressure is taken.  Your abdomen will be measured to track your baby's growth.  The fetal heartbeat will be listened to.  Any test results from the previous visit will be discussed.  You may have a cervical check near your due date to see if you have effaced. At around 36 weeks, your caregiver will check your cervix. At the same time, your caregiver will also perform a test on the secretions of the vaginal tissue. This test is to determine if a type of bacteria, Group B streptococcus, is present. Your caregiver will explain this further. Your caregiver may ask you:  What your birth plan is.  How you are feeling.  If you are feeling the baby move.  If you have had any abnormal symptoms, such as leaking fluid, bleeding, severe headaches, or abdominal cramping.  If you have any questions. Other tests or screenings that may be performed during your third trimester include:  Blood tests that check for low iron levels (anemia).  Fetal testing to check the health, activity level, and growth of the fetus. Testing is done if you have certain medical conditions or if there are problems during the pregnancy. FALSE LABOR You may feel small, irregular contractions that   eventually go away. These are called Braxton Hicks contractions, or false labor. Contractions may last for hours, days, or even weeks before true labor sets in. If contractions come at regular intervals, intensify, or become painful, it is best to be seen by your caregiver.  SIGNS OF LABOR   Menstrual-like cramps.  Contractions that are 5 minutes apart or less.  Contractions that start on the top of the uterus and spread down to the lower abdomen and back.  A sense of increased pelvic pressure or back  pain.  A watery or bloody mucus discharge that comes from the vagina. If you have any of these signs before the 37th week of pregnancy, call your caregiver right away. You need to go to the hospital to get checked immediately. HOME CARE INSTRUCTIONS   Avoid all smoking, herbs, alcohol, and unprescribed drugs. These chemicals affect the formation and growth of the baby.  Follow your caregiver's instructions regarding medicine use. There are medicines that are either safe or unsafe to take during pregnancy.  Exercise only as directed by your caregiver. Experiencing uterine cramps is a good sign to stop exercising.  Continue to eat regular, healthy meals.  Wear a good support bra for breast tenderness.  Do not use hot tubs, steam rooms, or saunas.  Wear your seat belt at all times when driving.  Avoid raw meat, uncooked cheese, cat litter boxes, and soil used by cats. These carry germs that can cause birth defects in the baby.  Take your prenatal vitamins.  Try taking a stool softener (if your caregiver approves) if you develop constipation. Eat more high-fiber foods, such as fresh vegetables or fruit and whole grains. Drink plenty of fluids to keep your urine clear or pale yellow.  Take warm sitz baths to soothe any pain or discomfort caused by hemorrhoids. Use hemorrhoid cream if your caregiver approves.  If you develop varicose veins, wear support hose. Elevate your feet for 15 minutes, 3-4 times a day. Limit salt in your diet.  Avoid heavy lifting, wear low heal shoes, and practice good posture.  Rest a lot with your legs elevated if you have leg cramps or low back pain.  Visit your dentist if you have not gone during your pregnancy. Use a soft toothbrush to brush your teeth and be gentle when you floss.  A sexual relationship may be continued unless your caregiver directs you otherwise.  Do not travel far distances unless it is absolutely necessary and only with the approval  of your caregiver.  Take prenatal classes to understand, practice, and ask questions about the labor and delivery.  Make a trial run to the hospital.  Pack your hospital bag.  Prepare the baby's nursery.  Continue to go to all your prenatal visits as directed by your caregiver. SEEK MEDICAL CARE IF:  You are unsure if you are in labor or if your water has broken.  You have dizziness.  You have mild pelvic cramps, pelvic pressure, or nagging pain in your abdominal area.  You have persistent nausea, vomiting, or diarrhea.  You have a bad smelling vaginal discharge.  You have pain with urination. SEEK IMMEDIATE MEDICAL CARE IF:   You have a fever.  You are leaking fluid from your vagina.  You have spotting or bleeding from your vagina.  You have severe abdominal cramping or pain.  You have rapid weight loss or gain.  You have shortness of breath with chest pain.  You notice sudden or extreme swelling   of your face, hands, ankles, feet, or legs.  You have not felt your baby move in over an hour.  You have severe headaches that do not go away with medicine.  You have vision changes. Document Released: 03/25/2001 Document Revised: 04/05/2013 Document Reviewed: 06/01/2012 Exit[employer] Patient Information 2015 [employer]. This information is not intended to replace advice given to you by your health care provider. Make sure you discuss any questions you have with your health care provider.  

## 2013-10-01 NOTE — MAU Note (Signed)
Was 2cm when in MAU last time.

## 2013-10-01 NOTE — MAU Note (Signed)
Patient states she is having contractions less than 10 minutes apart. Denies bleeding or leaking and reports good fetal movement.

## 2013-10-01 NOTE — Progress Notes (Signed)
Dr. Reola CalkinsBeck notified of pt in MAU for eval. Cervix unchanged from last MAU visit. EFM tracing reactive, will d/c home.

## 2013-10-04 ENCOUNTER — Ambulatory Visit (INDEPENDENT_AMBULATORY_CARE_PROVIDER_SITE_OTHER): Payer: Self-pay | Admitting: Obstetrics & Gynecology

## 2013-10-04 ENCOUNTER — Encounter: Payer: Self-pay | Admitting: Obstetrics & Gynecology

## 2013-10-04 VITALS — BP 120/80 | Wt 196.0 lb

## 2013-10-04 DIAGNOSIS — Z3400 Encounter for supervision of normal first pregnancy, unspecified trimester: Secondary | ICD-10-CM

## 2013-10-04 DIAGNOSIS — Z1389 Encounter for screening for other disorder: Secondary | ICD-10-CM

## 2013-10-04 DIAGNOSIS — Z3483 Encounter for supervision of other normal pregnancy, third trimester: Secondary | ICD-10-CM

## 2013-10-04 DIAGNOSIS — Z3480 Encounter for supervision of other normal pregnancy, unspecified trimester: Secondary | ICD-10-CM

## 2013-10-04 DIAGNOSIS — Z331 Pregnant state, incidental: Secondary | ICD-10-CM

## 2013-10-04 LAB — POCT URINALYSIS DIPSTICK
GLUCOSE UA: NEGATIVE
Ketones, UA: NEGATIVE
Leukocytes, UA: NEGATIVE
NITRITE UA: NEGATIVE
Protein, UA: NEGATIVE

## 2013-10-04 NOTE — Progress Notes (Signed)
[id number] [id number] Estimated Date of Delivery: 10/21/13  Blood pressure 120/80, weight 196 lb (88.905 kg), last menstrual period 01/14/2013.   BP weight and urine results all reviewed and noted.  Please refer to the obstetrical flow sheet for the fundal height and fetal heart rate documentation:  Patient reports good fetal movement, denies any bleeding and no rupture of membranes symptoms or regular contractions. Patient is without complaints. All questions were answered.  Plan:  Continued routine obstetrical care,   Follow up in 1 weeks for OB appointment, routine

## 2013-10-10 ENCOUNTER — Encounter (HOSPITAL_COMMUNITY): Payer: Self-pay | Admitting: *Deleted

## 2013-10-10 ENCOUNTER — Inpatient Hospital Stay (HOSPITAL_COMMUNITY)
Admission: AD | Admit: 2013-10-10 | Discharge: 2013-10-13 | DRG: 775 | Disposition: A | Discharge status: Home / Self Care | Payer: Medicaid Other | Source: Ambulatory Visit | Attending: Obstetrics & Gynecology | Admitting: Obstetrics & Gynecology

## 2013-10-10 DIAGNOSIS — Z3490 Encounter for supervision of normal pregnancy, unspecified, unspecified trimester: Secondary | ICD-10-CM

## 2013-10-10 DIAGNOSIS — Z87891 Personal history of nicotine dependence: Secondary | ICD-10-CM

## 2013-10-10 DIAGNOSIS — O360990 Maternal care for other rhesus isoimmunization, unspecified trimester, not applicable or unspecified: Secondary | ICD-10-CM | POA: Diagnosis present

## 2013-10-10 DIAGNOSIS — O26839 Pregnancy related renal disease, unspecified trimester: Secondary | ICD-10-CM | POA: Diagnosis present

## 2013-10-10 DIAGNOSIS — Z87442 Personal history of urinary calculi: Secondary | ICD-10-CM

## 2013-10-10 DIAGNOSIS — O4290 Premature rupture of membranes, unspecified as to length of time between rupture and onset of labor, unspecified weeks of gestation: Principal | ICD-10-CM | POA: Diagnosis not present

## 2013-10-10 DIAGNOSIS — Z87410 Personal history of cervical dysplasia: Secondary | ICD-10-CM

## 2013-10-10 DIAGNOSIS — O09899 Supervision of other high risk pregnancies, unspecified trimester: Secondary | ICD-10-CM

## 2013-10-10 DIAGNOSIS — Z283 Underimmunization status: Secondary | ICD-10-CM

## 2013-10-10 DIAGNOSIS — Z2839 Other underimmunization status: Secondary | ICD-10-CM

## 2013-10-10 DIAGNOSIS — N189 Chronic kidney disease, unspecified: Secondary | ICD-10-CM | POA: Diagnosis present

## 2013-10-10 LAB — CBC
HEMATOCRIT: 35.4 % — AB (ref 36.0–46.0)
HEMOGLOBIN: 12.2 g/dL (ref 12.0–15.0)
MCH: 28.8 pg (ref 26.0–34.0)
MCHC: 34.5 g/dL (ref 30.0–36.0)
MCV: 83.7 fL (ref 78.0–100.0)
Platelets: 182 10*3/uL (ref 150–400)
RBC: 4.23 MIL/uL (ref 3.87–5.11)
RDW: 14.5 % (ref 11.5–15.5)
WBC: 14.9 10*3/uL — AB (ref 4.0–10.5)

## 2013-10-10 MED ORDER — CITRIC ACID-SODIUM CITRATE 334-500 MG/5ML PO SOLN
30.0000 mL | ORAL | Status: DC | PRN
  Filled 2013-10-10: qty 15

## 2013-10-10 MED ORDER — LACTATED RINGERS IV SOLN
INTRAVENOUS | Status: DC
  Administered 2013-10-10 – 2013-10-11 (×3): via INTRAVENOUS

## 2013-10-10 MED ORDER — LIDOCAINE HCL (PF) 1 % IJ SOLN
30.0000 mL | INTRAMUSCULAR | Status: DC | PRN
  Filled 2013-10-10: qty 30

## 2013-10-10 MED ORDER — LACTATED RINGERS IV SOLN
500.0000 mL | INTRAVENOUS | Status: DC | PRN
  Administered 2013-10-11 (×3): 500 mL via INTRAVENOUS

## 2013-10-10 MED ORDER — ACETAMINOPHEN 325 MG PO TABS: 650.0000 mg | ORAL_TABLET | ORAL | Status: DC | PRN

## 2013-10-10 MED ORDER — FENTANYL CITRATE 0.05 MG/ML IJ SOLN
100.0000 ug | INTRAMUSCULAR | Status: DC | PRN
  Administered 2013-10-11 (×2): 100 ug via INTRAVENOUS
  Filled 2013-10-10 (×2): qty 2

## 2013-10-10 MED ORDER — OXYTOCIN BOLUS FROM INFUSION: 500.0000 mL | INTRAVENOUS | Status: DC

## 2013-10-10 MED ORDER — IBUPROFEN 600 MG PO TABS: 600.0000 mg | ORAL_TABLET | Freq: Four times a day (QID) | ORAL | Status: DC | PRN

## 2013-10-10 MED ORDER — OXYCODONE-ACETAMINOPHEN 5-325 MG PO TABS
1.0000 | ORAL_TABLET | ORAL | Status: DC | PRN
Start: 2013-10-10 — End: 2013-10-11

## 2013-10-10 MED ORDER — ONDANSETRON HCL 4 MG/2ML IJ SOLN
4.0000 mg | Freq: Four times a day (QID) | INTRAMUSCULAR | Status: DC | PRN
  Administered 2013-10-11: 4 mg via INTRAVENOUS
  Filled 2013-10-10: qty 2

## 2013-10-10 MED ORDER — OXYTOCIN 40 UNITS IN LACTATED RINGERS INFUSION - SIMPLE MED
62.5000 mL/h | INTRAVENOUS | Status: DC
  Administered 2013-10-11: 62.5 mL/h via INTRAVENOUS

## 2013-10-10 NOTE — H&P (Signed)
Jodi King is a 23 y.o. female G1P0000 with IUP at 5272w3d by LMP = 6wk US presenting for SROM. Pt states she has been having regular, every 5 minutes contractions, associated with none vaginal bleeding.  Membranes are ruptured, reported as clear, with active fetal movement.   [facility] at [facility] since 8 wks  Prenatal History/Complications: Clinic  [facility]   FOB  Jodi King, [place]30yo, white, 1st baby   Pap  03/15/13: ASCUS w/ +HRHPV,colpo: HPV atypia- repeat 6-8wk pp   GC/CT  Initial: -/- 36+wks: -/-   Genetic Screen  NT/IT: neg   CF screen  neg   Anatomic US  Normal female 'Hayden'   Flu vaccine  03/15/13   Glucose Screen  2 hr 85/122/103   GBS  neg   Feed Preference  breast   Contraception  depo   Circumcision  Yes, if boy at [facility]   Childbirth Classes  Recommended, info given- didn't make it   Pediatrician  Undecided, info given   HSV2 Negative  Patient Active Problem List   Diagnosis Date Noted  . Dysplasia of cervix, unspecified 08/05/2013  . Seasonal allergies 07/20/2013  . Susceptible to varicella (non-immune), currently pregnant 03/18/2013  . Rh negative state in antepartum period 03/18/2013  . Supervision of normal first pregnancy 03/15/2013    Past Medical History: Past Medical History  Diagnosis Date  . Chronic kidney disease     kidney stones  . Miscarriage   . Seasonal allergies 07/20/2013  . Kidney stones     Past Surgical History: Past Surgical History  Procedure Laterality Date  . Fracture surgery    . Femur fracture surgery      Obstetrical History: OB History   Grav Para Term Preterm Abortions TAB SAB Ect Mult Living   1    0  0         Gynecological History: Abnormal Pap in pregnancy, qill require repeat colpo 6 - 8 weeks PP  Social History: History   Social History  . Marital Status: Single    Spouse Name: N/A    Number of Children: N/A  . Years of Education: N/A   Social History Main Topics  . Smoking status: Former Smoker    Types:  Cigarettes    Quit date: 08/09/2011  . Smokeless tobacco: Never Used  . Alcohol Use: No     Comment: not now  . Drug Use: No  . Sexual Activity: Yes    Birth Control/ Protection: None   Other Topics Concern  . None   Social History Narrative  . None    Family History: Family History  Problem Relation Age of Onset  . Hypertension Mother   . Nephrolithiasis Mother     Allergies: No Known Allergies  Prescriptions prior to admission  Medication Sig Dispense Refill  . acetaminophen (TYLENOL) 325 MG tablet Take 325 mg by mouth every 6 (six) hours as needed for moderate pain.      . Prenatal Multivit-Min-Fe-FA (PRENATAL VITAMINS PO) Take 1 tablet by mouth daily.      Gerarda Fraction. Valerian EXTR Take 2 tablets by mouth at bedtime.          Review of Systems   Constitutional: no fever/chills, no ha/vc/scotomata, no RUQ pain, no edema. No chest pain/SOB. Some discomfort with ctx but tolerable.  Blood pressure 118/71, pulse 91, temperature 98.7 F (37.1 C), temperature source Oral, resp. rate 18, height 5' (1.524 m), weight 89.585 kg (197 lb 8 oz), last menstrual  period 01/14/2013. General appearance: alert, cooperative and no distress Lungs: clear to auscultation bilaterally Heart: regular rate and rhythm Abdomen: soft, non-tender; bowel sounds normal Extremities: Homans sign is negative, no sign of DVT Presentation: cephalic Fetal monitoringBaseline: 140 bpm, Variability: Good {> 6 bpm), Accelerations: present and Decelerations: Absent Uterine activityDate/time of onset: 745pm, Frequency: Every 5 minutes and Intensity: mild     Prenatal labs: ABO, Rh: O/NEG/-- (12/02 1053) Antibody: NEG (04/24 1010) Rubella:  Immune RPR: NON REAC (04/24 1010)  HBsAg: NEGATIVE (12/02 1053)  HIV: NONREACTIVE (04/24 1010)  GBS: NOT DETECTED (06/15 1322)  2 hr Glucola wnl Genetic screening  neg Anatomy US normal female    Prenatal Transfer Tool  Maternal Diabetes: No Genetic Screening:  Normal Maternal Ultrasounds/Referrals: Normal Fetal Ultrasounds or other Referrals:  None Maternal Substance Abuse:  No Significant Maternal Medications:  None Significant Maternal Lab Results: Lab values include: Group B Strep negative     No results found for this or any previous visit (from the past 24 hour(s)).  Assessment: Jodi King is a 23 y.o. G1P0000 at 5137w3d by L here for SROM #Labor: SROM, will recheck for cervical change and consider augmentation if needed.  #Pain: IV meds, declines epidural #FWB: Categ I #ID:  GBS neg #MOF: Breast #MOC: Depo #Circ:  outpatient  Sunnie Nielsenlexander, Natalie 10/10/2013, 9:32 PM   I spoke with and examined patient and agree with resident's note and plan of care.  Tawana ScaleMichael Ryan Odom, MD OB Fellow 10/11/2013 4:15 AM

## 2013-10-10 NOTE — MAU Note (Addendum)
PT SAYS SHE GETS  PNC - AT [facility]-     SEEN LAST  WEEK-   VE- 2 CM.  HAS AN APPOINTMENT  TOMORROW-    DENIES HSV AND MRSA.     GBS-  UNSURE.    SAYS SHE IS HAVING UC    SAYS SROM  AT 725PM-  CLEAR..Marland Kitchen

## 2013-10-11 ENCOUNTER — Inpatient Hospital Stay (HOSPITAL_COMMUNITY): Payer: Medicaid Other | Admitting: Anesthesiology

## 2013-10-11 ENCOUNTER — Encounter (HOSPITAL_COMMUNITY): Payer: Self-pay | Admitting: *Deleted

## 2013-10-11 ENCOUNTER — Encounter (HOSPITAL_COMMUNITY): Payer: Medicaid Other | Admitting: Anesthesiology

## 2013-10-11 ENCOUNTER — Encounter: Payer: Medicaid Other | Admitting: Women's Health

## 2013-10-11 DIAGNOSIS — Z3490 Encounter for supervision of normal pregnancy, unspecified, unspecified trimester: Secondary | ICD-10-CM

## 2013-10-11 DIAGNOSIS — O26839 Pregnancy related renal disease, unspecified trimester: Secondary | ICD-10-CM

## 2013-10-11 DIAGNOSIS — O4290 Premature rupture of membranes, unspecified as to length of time between rupture and onset of labor, unspecified weeks of gestation: Secondary | ICD-10-CM

## 2013-10-11 LAB — RPR

## 2013-10-11 MED ORDER — LIDOCAINE HCL (PF) 1 % IJ SOLN
INTRAMUSCULAR | Status: DC | PRN
  Administered 2013-10-11 (×2): 5 mL

## 2013-10-11 MED ORDER — FENTANYL 2.5 MCG/ML BUPIVACAINE 1/10 % EPIDURAL INFUSION (WH - ANES)
14.0000 mL/h | INTRAMUSCULAR | Status: DC | PRN
  Administered 2013-10-11: 14 mL/h via EPIDURAL

## 2013-10-11 MED ORDER — DIBUCAINE 1 % RE OINT: 1.0000 "application " | TOPICAL_OINTMENT | RECTAL | Status: DC | PRN

## 2013-10-11 MED ORDER — SIMETHICONE 80 MG PO CHEW: 80.0000 mg | CHEWABLE_TABLET | ORAL | Status: DC | PRN

## 2013-10-11 MED ORDER — BENZOCAINE-MENTHOL 20-0.5 % EX AERO
1.0000 "application " | INHALATION_SPRAY | CUTANEOUS | Status: DC | PRN
  Filled 2013-10-11: qty 56

## 2013-10-11 MED ORDER — TERBUTALINE SULFATE 1 MG/ML IJ SOLN: 0.2500 mg | Freq: Once | INTRAMUSCULAR | Status: DC | PRN

## 2013-10-11 MED ORDER — FENTANYL 2.5 MCG/ML BUPIVACAINE 1/10 % EPIDURAL INFUSION (WH - ANES)
INTRAMUSCULAR | Status: AC
  Filled 2013-10-11: qty 125

## 2013-10-11 MED ORDER — SENNOSIDES-DOCUSATE SODIUM 8.6-50 MG PO TABS
2.0000 | ORAL_TABLET | ORAL | Status: DC
  Administered 2013-10-11 – 2013-10-13 (×2): 2 via ORAL
  Filled 2013-10-11 (×2): qty 2

## 2013-10-11 MED ORDER — DIPHENHYDRAMINE HCL 25 MG PO CAPS: 25.0000 mg | ORAL_CAPSULE | Freq: Four times a day (QID) | ORAL | Status: DC | PRN

## 2013-10-11 MED ORDER — IBUPROFEN 600 MG PO TABS
600.0000 mg | ORAL_TABLET | Freq: Four times a day (QID) | ORAL | Status: DC
  Administered 2013-10-11 – 2013-10-13 (×7): 600 mg via ORAL
  Filled 2013-10-11 (×7): qty 1

## 2013-10-11 MED ORDER — MEASLES, MUMPS & RUBELLA VAC ~~LOC~~ INJ: 0.5000 mL | INJECTION | Freq: Once | SUBCUTANEOUS | Status: DC

## 2013-10-11 MED ORDER — DIPHENHYDRAMINE HCL 50 MG/ML IJ SOLN: 12.5000 mg | INTRAMUSCULAR | Status: DC | PRN

## 2013-10-11 MED ORDER — PRENATAL MULTIVITAMIN CH
1.0000 | ORAL_TABLET | Freq: Every day | ORAL | Status: DC
  Administered 2013-10-12 – 2013-10-13 (×2): 1 via ORAL
  Filled 2013-10-11 (×2): qty 1

## 2013-10-11 MED ORDER — ZOLPIDEM TARTRATE 5 MG PO TABS: 5.0000 mg | ORAL_TABLET | Freq: Every evening | ORAL | Status: DC | PRN

## 2013-10-11 MED ORDER — EPHEDRINE 5 MG/ML INJ
10.0000 mg | INTRAVENOUS | Status: DC | PRN
  Filled 2013-10-11: qty 2

## 2013-10-11 MED ORDER — FENTANYL 2.5 MCG/ML BUPIVACAINE 1/10 % EPIDURAL INFUSION (WH - ANES)
INTRAMUSCULAR | Status: DC | PRN
  Administered 2013-10-11: 14 mL/h via EPIDURAL

## 2013-10-11 MED ORDER — TETANUS-DIPHTH-ACELL PERTUSSIS 5-2.5-18.5 LF-MCG/0.5 IM SUSP
0.5000 mL | Freq: Once | INTRAMUSCULAR | Status: AC
  Administered 2013-10-12: 0.5 mL via INTRAMUSCULAR
  Filled 2013-10-11: qty 0.5

## 2013-10-11 MED ORDER — PHENYLEPHRINE 40 MCG/ML (10ML) SYRINGE FOR IV PUSH (FOR BLOOD PRESSURE SUPPORT)
PREFILLED_SYRINGE | INTRAVENOUS | Status: AC
  Filled 2013-10-11: qty 10

## 2013-10-11 MED ORDER — LANOLIN HYDROUS EX OINT
TOPICAL_OINTMENT | CUTANEOUS | Status: DC | PRN
Start: 2013-10-11 — End: 2013-10-13

## 2013-10-11 MED ORDER — OXYTOCIN 40 UNITS IN LACTATED RINGERS INFUSION - SIMPLE MED
1.0000 m[IU]/min | INTRAVENOUS | Status: DC
  Administered 2013-10-11: 2 m[IU]/min via INTRAVENOUS
  Filled 2013-10-11: qty 1000

## 2013-10-11 MED ORDER — OXYCODONE-ACETAMINOPHEN 5-325 MG PO TABS
1.0000 | ORAL_TABLET | ORAL | Status: DC | PRN
  Administered 2013-10-12 (×2): 1 via ORAL
  Filled 2013-10-11 (×2): qty 1

## 2013-10-11 MED ORDER — WITCH HAZEL-GLYCERIN EX PADS: 1.0000 "application " | MEDICATED_PAD | CUTANEOUS | Status: DC | PRN

## 2013-10-11 MED ORDER — PHENYLEPHRINE 40 MCG/ML (10ML) SYRINGE FOR IV PUSH (FOR BLOOD PRESSURE SUPPORT)
80.0000 ug | PREFILLED_SYRINGE | INTRAVENOUS | Status: DC | PRN
  Administered 2013-10-11: 10:00:00 via INTRAVENOUS
  Filled 2013-10-11: qty 2

## 2013-10-11 MED ORDER — ONDANSETRON HCL 4 MG PO TABS: 4.0000 mg | ORAL_TABLET | ORAL | Status: DC | PRN

## 2013-10-11 MED ORDER — LACTATED RINGERS IV SOLN: 500.0000 mL | Freq: Once | INTRAVENOUS | Status: DC

## 2013-10-11 MED ORDER — ONDANSETRON HCL 4 MG/2ML IJ SOLN: 4.0000 mg | INTRAMUSCULAR | Status: DC | PRN

## 2013-10-11 MED ORDER — PHENYLEPHRINE 40 MCG/ML (10ML) SYRINGE FOR IV PUSH (FOR BLOOD PRESSURE SUPPORT)
80.0000 ug | PREFILLED_SYRINGE | INTRAVENOUS | Status: DC | PRN
Start: 2013-10-11 — End: 2013-10-11
  Filled 2013-10-11: qty 2

## 2013-10-11 NOTE — Anesthesia Preprocedure Evaluation (Signed)
Anesthesia Evaluation  Patient identified by MRN, date of birth, ID band Patient awake    Reviewed: Allergy & Precautions, H&P , Patient's Chart, lab work & pertinent test results  Airway Mallampati: II TM Distance: >3 FB Neck ROM: full    Dental   Pulmonary former smoker,  breath sounds clear to auscultation        Cardiovascular Rhythm:regular Rate:Normal     Neuro/Psych    GI/Hepatic   Endo/Other    Renal/GU Renal disease     Musculoskeletal   Abdominal   Peds  Hematology   Anesthesia Other Findings   Reproductive/Obstetrics (+) Pregnancy                           Anesthesia Physical Anesthesia Plan  ASA: II  Anesthesia Plan: Epidural   Post-op Pain Management:    Induction:   Airway Management Planned:   Additional Equipment:   Intra-op Plan:   Post-operative Plan:   Informed Consent: I have reviewed the patients History and Physical, chart, labs and discussed the procedure including the risks, benefits and alternatives for the proposed anesthesia with the patient or authorized representative who has indicated his/her understanding and acceptance.     Plan Discussed with:   Anesthesia Plan Comments:         Anesthesia Quick Evaluation  

## 2013-10-11 NOTE — Progress Notes (Signed)
Jodi MauSamantha King is a 23 y.o. G1P0000 at 3833w4d admitted for rupture of membranes  Subjective: No complaints, Feeling ctx. No acute issues at this time  Objective: BP 137/78  Pulse 86  Temp(Src) 98.5 F (36.9 C) (Oral)  Resp 18  Ht 5' (1.524 m)  Wt 89.359 kg (197 lb)  BMI 38.47 kg/m2  LMP 01/14/2013      FHT:  FHR: 130s bpm, variability: moderate,  accelerations:  Present,  decelerations:  Absent UC:   regular, every 2-4 minutes SVE:   Dilation: 4 Effacement (%): 60 Station: -1 Exam by:: Dr. Ike Benedom  Labs: Lab Results  Component Value Date   WBC 14.9* 10/10/2013   HGB 12.2 10/10/2013   HCT 35.4* 10/10/2013   MCV 83.7 10/10/2013   PLT 182 10/10/2013    Assessment / Plan: Augmentation of labor, progressing well  Labor: augmenting with pitocin for PROM. Pt tolerating Preeclampsia:  no signs or symptoms of toxicity Fetal Wellbeing:  Category I Pain Control:  Fentanyl I/D:  GBS neg Anticipated MOD:  NSVD  ODOM, MICHAEL RYAN 10/11/2013, 4:16 AM

## 2013-10-11 NOTE — Progress Notes (Signed)
Jodi King is a 23 y.o. G1P0000 at 5685w4d admitted for rupture of membranes  Subjective:  Doing well. S/p epidural.  +FM.  Had a prolonged decel approx 1 hour ago and pit was turned off but then baby recovered well and has been doing well now.    Objective: BP 112/67  Pulse 79  Temp(Src) 98.7 F (37.1 C) (Oral)  Resp 18  Ht 5' (1.524 m)  Wt 89.359 kg (197 lb)  BMI 38.47 kg/m2  SpO2 99%  LMP 01/14/2013      FHT:  FHR: 125 bpm, variability: moderate,  accelerations:  Present,  decelerations:  Present variables occasional and single prolonged UC:   irregular, every 1-5 minutes SVE:   Dilation: 5 Effacement (%): 90 Station: 0 Exam by:: J.Thornton, RN  Labs: Lab Results  Component Value Date   WBC 14.9* 10/10/2013   HGB 12.2 10/10/2013   HCT 35.4* 10/10/2013   MCV 83.7 10/10/2013   PLT 182 10/10/2013    Assessment / Plan: IOL for PROM  Labor: was progressing on pit but now has been turned off. IUPC placed without difficulty. restart pit.  Fetal Wellbeing:  Category II Pain Control:  Epidural I/D:  n/a Anticipated MOD:  NSVD but discussed that should baby not tolerate pitocin, may need to discuss possible c/s  Madysen Faircloth L 10/11/2013, 12:11 PM

## 2013-10-11 NOTE — Progress Notes (Signed)
Jodi King is a 23 y.o. G1P0000 at 1556w4d admitted for rupture of membranes  Subjective:  Doing well. Evaluated at 1330 for another prolonged decel and found to have changed dramatically from 5 to 7.5cm.  Stretchy cervix. Now doing well.  +FM.   Objective: BP 103/58  Pulse 97  Temp(Src) 98.7 F (37.1 C) (Oral)  Resp 20  Ht 5' (1.524 m)  Wt 89.359 kg (197 lb)  BMI 38.47 kg/m2  SpO2 99%  LMP 01/14/2013      FHT:  FHR: 125 bpm, variability: moderate,  accelerations:  Present,  decelerations:  Present now occasional variables UC:   Irregular. MVUs of 115-130. Not adequate.     SVE:   Dilation: 7.5 Effacement (%): 100 Station: 0 Exam by:: Dr.Dove  Labs: Lab Results  Component Value Date   WBC 14.9* 10/10/2013   HGB 12.2 10/10/2013   HCT 35.4* 10/10/2013   MCV 83.7 10/10/2013   PLT 182 10/10/2013    Assessment / Plan: IOL due to PROM  Labor: on pitocin, not adequate with contractions. cont to increase pit.   Fetal Wellbeing:  Category II Pain Control:  Epidural I/D:  gbs neg Anticipated MOD:  should baby tolerate adequate contractions, anticipate SVD. However, pt still aware that should baby not tolerate the increase in pitocin, we may need to discuss cesarean delivery  Jodi King L 10/11/2013, 3:23 PM

## 2013-10-11 NOTE — Progress Notes (Signed)
Jodi King is a 23 y.o. G1P0000 at 562w4d admitted for rupture of membranes  Subjective: No complaints, Feeling ctx. Painful. No acute issues at this time  Objective: BP 127/75  Pulse 91  Temp(Src) 98.7 F (37.1 C) (Oral)  Resp 18  Ht 5' (1.524 m)  Wt 89.359 kg (197 lb)  BMI 38.47 kg/m2  LMP 01/14/2013      FHT:  FHR: 120s bpm, variability: moderate,  accelerations:  Present,  decelerations:  Absent UC:   regular, every 2-4 minutes SVE:   Dilation: 4 Effacement (%): 60 Station: -1 Exam by:: Dr. Ike Benedom  Labs: Lab Results  Component Value Date   WBC 14.9* 10/10/2013   HGB 12.2 10/10/2013   HCT 35.4* 10/10/2013   MCV 83.7 10/10/2013   PLT 182 10/10/2013    Assessment / Plan: Augmenting PROM. Currently on pitociin  Labor: augmenting with pitocin for PROM. Pt tolerating. Will increase. Has not moved to active labor Preeclampsia:  no signs or symptoms of toxicity Fetal Wellbeing:  Category I Pain Control:  Fentanyl I/D:  GBS neg Anticipated MOD:  NSVD  ODOM, MICHAEL RYAN 10/11/2013, 6:30 AM

## 2013-10-11 NOTE — Progress Notes (Signed)
Jodi King is a 23 y.o. G1P0000 at [redacted]w[redacted]d admitted for rupture of membranes  Subjective: Called to room for another decel.  HR down in the 90s for 10 min.    Objective: BP 103/58  Pulse 97  Temp(Src) 98.7 F (37.1 C) (Oral)  Resp 20  Ht 5' (1.524 m)  Wt 89.359 kg (197 lb)  BMI 38.47 kg/m2  SpO2 99%  LMP 01/14/2013      FHT:  FHR: 130 bpm, variability: min to mod,  accelerations:  Present,  decelerations:  Present prolonged decel to the 90s x10 min UC:   irregular, every 2-5 minutes SVE:   Dilation: Lip/rim Effacement (%): 100 Station: +1 Exam by:: Jodi King  Labs: Lab Results  Component Value Date   WBC 14.9* 10/10/2013   HGB 12.2 10/10/2013   HCT 35.4* 10/10/2013   MCV 83.7 10/10/2013   PLT 182 10/10/2013    Assessment / Plan: Induction of labor due to PROM,  progressing well on pitocin  Labor: prolonged decel but cervix now with just an anterior lip and +1, +2 station.  Baby with good scalp stim and variability. Pushed x 3 contractions but unable to reduce lip fully. cont to labor down and start pushing when complete and +2.  Fetal Wellbeing:  Category II Pain Control:  Epidural I/D:  n/a Anticipated MOD:  NSVD- if baby continues to have reserve as currently being demonstrated.  Pt still aware of c/s risk.   Jodi King 10/11/2013, 3:52 PM

## 2013-10-11 NOTE — Anesthesia Procedure Notes (Signed)
Epidural Patient location during procedure: OB Start time: 10/11/2013 9:55 AM  Staffing Anesthesiologist: Brayton CavesJACKSON, FREEMAN Performed by: anesthesiologist   Preanesthetic Checklist Completed: patient identified, site marked, surgical consent, pre-op evaluation, timeout performed, IV checked, risks and benefits discussed and monitors and equipment checked  Epidural Patient position: sitting Prep: site prepped and draped and DuraPrep Patient monitoring: continuous pulse ox and blood pressure Approach: midline Location: L3-L4 Injection technique: LOR air  Needle:  Needle type: Tuohy  Needle gauge: 17 G Needle length: 9 cm and 9 Needle insertion depth: 5 cm cm Catheter type: closed end flexible Catheter size: 19 Gauge Catheter at skin depth: 10 cm Test dose: negative  Assessment Events: blood not aspirated, injection not painful, no injection resistance, negative IV test and no paresthesia  Additional Notes Patient identified.  Risk benefits discussed including failed block, incomplete pain control, headache, nerve damage, paralysis, blood pressure changes, nausea, vomiting, reactions to medication both toxic or allergic, and postpartum back pain.  Patient expressed understanding and wished to proceed.  All questions were answered.  Sterile technique used throughout procedure and epidural site dressed with sterile barrier dressing. No paresthesia or other complications noted.The patient did not experience any signs of intravascular injection such as tinnitus or metallic taste in mouth nor signs of intrathecal spread such as rapid motor block. Please see nursing notes for vital signs.

## 2013-10-12 NOTE — Progress Notes (Signed)
UR chart review completed.  

## 2013-10-12 NOTE — Progress Notes (Signed)
Post Partum Day 1 Subjective: no complaints, up ad lib, voiding and tolerating PO  Objective: Blood pressure 94/60, pulse 94, temperature 98.4 F (36.9 C), temperature source Oral, resp. rate 20, height 5' (1.524 m), weight 89.359 kg (197 lb), last menstrual period 01/14/2013, SpO2 99.00%, unknown if currently breastfeeding.  Physical Exam:  General: alert, cooperative and no distress Lochia: appropriate Uterine Fundus: firm Incision: na DVT Evaluation: No evidence of DVT seen on physical exam. No cords or calf tenderness. No significant calf/ankle edema.   Recent Labs  10/10/13 2150  HGB 12.2  HCT 35.4*    Assessment/Plan: Plan for discharge tomorrow, Breastfeeding and Contraception depo   LOS: 2 days   Advika Mclelland L 10/12/2013, 7:55 AM

## 2013-10-12 NOTE — Anesthesia Postprocedure Evaluation (Signed)
  Anesthesia Post-op Note  Patient: Jodi King  Procedure(s) Performed: * No procedures listed *  Patient Location: PACU and Mother/Baby  Anesthesia Type:Epidural  Level of Consciousness: awake, alert  and oriented  Airway and Oxygen Therapy: Patient Spontanous Breathing  Post-op Pain: mild  Post-op Assessment: Patient's Cardiovascular Status Stable, Respiratory Function Stable, No signs of Nausea or vomiting, Adequate PO intake, Pain level controlled, No headache, No backache, No residual numbness and No residual motor weakness  Post-op Vital Signs: Reviewed and stable  Last Vitals:  Filed Vitals:   10/12/13 0540  BP: 94/60  Pulse: 94  Temp: 36.9 C  Resp: 20    Complications: No apparent anesthesia complications

## 2013-10-12 NOTE — Lactation Note (Addendum)
This note was copied from the chart of Boy Jacqualine MauSamantha Kalb. Lactation Consultation Note  Initial consult:  P1, Mother has been attempting with only 1 successful feeding since birth. Mother has being doing lots of STS. Reviewed hand expression and mother was able to express drops of colostrum. Mother undressed baby and FOB changed diaper to wake sleepy baby. Mother has large breasts.  Demonstrated "teacup hold" to latch baby with teachback. Baby latched.  Sucks and swallows observed for more than 15 min with stimulation using breast compression and massage. Mother was able to switch him to other breast for a feeding total of 45 min. Reviewed unlatching, cluster feeding and Mom encouraged to feed baby 8-12 times/24 hours and with feeding cues.  Mom made aware of O/P services, breastfeeding support groups, community resources, and our phone # for post-discharge questions.       Patient Name: [name] LKGMW'NToday's Date: 10/12/2013 Reason for consult: Initial assessment   Maternal Data    Feeding Feeding Type: Breast Fed  LATCH Score/Interventions Latch: Repeated attempts needed to sustain latch, nipple held in mouth throughout feeding, stimulation needed to elicit sucking reflex. Intervention(s): Adjust position;Assist with latch;Breast massage;Breast compression  Audible Swallowing: A few with stimulation Intervention(s): Hand expression;Skin to skin Intervention(s): Alternate breast massage  Type of Nipple: Everted at rest and after stimulation  Comfort (Breast/Nipple): Soft / non-tender     Hold (Positioning): Assistance needed to correctly position infant at breast and maintain latch.  LATCH Score: 7  Lactation Tools Discussed/Used     Consult Status Consult Status: Follow-up Date: 10/13/13 Follow-up type: In-patient    Dahlia ByesBerkelhammer, Kecia Swoboda [facility] 10/12/2013, 2:49 PM

## 2013-10-13 MED ORDER — ACETAMINOPHEN-CODEINE 300-30 MG PO TABS: 1.0000 | ORAL_TABLET | ORAL | Status: DC | PRN

## 2013-10-13 MED ORDER — IBUPROFEN 600 MG PO TABS: 600.0000 mg | ORAL_TABLET | Freq: Four times a day (QID) | ORAL | Status: DC

## 2013-10-13 NOTE — Discharge Instructions (Signed)

## 2013-10-13 NOTE — Discharge Summary (Signed)
Obstetric Discharge Summary Reason for Admission: onset of labor and rupture of membranes Prenatal Procedures: NST and ultrasound Intrapartum Procedures: spontaneous vaginal delivery and episiotomy 2nd degree for decreased fetal heart rate. Postpartum Procedures: Rho(D) Ig Complications-Operative and Postpartum: 2nd degree perineal laceration Hemoglobin  Date Value Ref Range Status  10/10/2013 12.2  12.0 - 15.0 g/dL Final     HCT  Date Value Ref Range Status  10/10/2013 35.4* 36.0 - 46.0 % Final   Subjective: Pt reports increased cramping, with decreased bleeding.  Breast and bottlefeeding.  Desires depo for family planning.    Physical Exam:  General: alert, cooperative and appears stated age Lochia: appropriate Uterine Fundus: firm Incision: n/a DVT Evaluation: No evidence of DVT seen on physical exam. Negative Homan's sign.  Discharge Diagnoses: Term Pregnancy-delivered  Discharge Information: Date: 10/13/2013 Activity: pelvic rest Diet: routine Medications: Tylenol #3 and Iron Condition: stable Instructions: refer to practice specific booklet Discharge to: home Follow-up Information   Follow up with [facility] In 6 weeks.   Contact information:   [address] [place] [id number] [phone]    Delivery Note At 5:39 PM a viable female was delivered via Vaginal, Spontaneous Delivery (Presentation: Left Occiput Anterior).  APGAR: 8, 9; weight TBD.   Placenta status: Intact, Spontaneous.  Cord: 3 vessels with the following complications: None.  Cord pH: 7.33  Anesthesia: Epidural  Episiotomy: Median Lacerations: 2nd degree Suture Repair: 3.0 vicryl rapide Est. Blood Loss (mL): 450cc  Mom to postpartum.  Baby to Couplet care / Skin to Skin.  Pt pushed for 1 hour with deep decelerations for the last 15 min down to the 90s with each contraction.  In the last 2 contractions the HR remained in the 70s between contractions. Mayo scissors were used to cut  a median episiotomy and pt pushed one time after that and delivered a liveborn female via NSVD with spontaneous cry.  Tight nuchal cord x1 that had to be delivered through. Baby placed on maternal abdomen.  Delayed cord clamping performed.  Cord cut by FOB.  Placenta delivered intact with 3V cord via traction and pitocin.  2nd degree tear from episiotomy that did not extend. No complications.  Mom and baby to postpartum.   BECK, KELI L 10/11/2013, 6:12 PM    Newborn Data: Live born female  Birth Weight: 7 lb 0.4 oz (3187 g) APGAR: 8, 9  Home with mother.  [facility] Inc[facility]UHAMMAD,Jorgina Binning 10/13/2013, 7:14 AM

## 2013-10-13 NOTE — Lactation Note (Signed)
This note was copied from the chart of Jodi Jacqualine MauSamantha Manning. Lactation Consultation Note 1009: Mom states baby just fed and is sound asleep. Request consultation later. Phone number given to mom to call me when ready.  1132: Mom called to request latch assessment. Baby wide awake and showing hunger cues. Assisted mom to place baby in football hold on the right side. Used several pillows for support, and a rolled towel to support mom's breast. Mom states that she is a lot more comfortable with all the pillows and the extra support.   Baby latched well with rhythmic sucking and audible swallowing. After 20 minutes he self detached sound asleep. Mom c/o slight nipple pain; comfort gels provided with instructions for use.   Instructed mom and dad to support baby higher (they had been using only one flat pillow), and to support the breast with a rolled towel. Enc mom to call for help if needed.   Patient Name: Jodi King UJWJX'BToday's Date: 10/13/2013     Maternal Data    Feeding Feeding Type: Breast Fed Length of feed: 20 min  LATCH Score/Interventions Latch: Grasps breast easily, tongue down, lips flanged, rhythmical sucking.  Audible Swallowing: Spontaneous and intermittent  Type of Nipple: Everted at rest and after stimulation  Comfort (Breast/Nipple): Filling, red/small blisters or bruises, mild/mod discomfort  Problem noted: Mild/Moderate discomfort Interventions (Mild/moderate discomfort): Comfort gels  Hold (Positioning): Assistance needed to correctly position infant at breast and maintain latch.  LATCH Score: 8  Lactation Tools Discussed/Used     Consult Status Consult Status: PRN Follow-up type: In-patient    Octavio MannsSanders, Mate [facility] 10/13/2013, 12:50 PM

## 2013-10-13 NOTE — Lactation Note (Signed)
This note was copied from the chart of Jodi Jacqualine MauSamantha Azure. Lactation Consultation Note Mom request feeding assessment.  Baby latched football on the left side by mom and dad without assistance, using several pillows and good support. Mom appears very comfortable and relaxed. Mom does state nipple is still sore; using comfort gels.  Baby is latched with rhythmic sucking and audible swallowing. Falling asleep some at the breast, mom using waking techniques to keep him feeding.   Instructed mom to call pediatrician if she has any concerns: decreased diapers, baby not feeding at least 8 times a day, not hearing swallows while he feeds, increase in breast pain.   Patient Name: Jodi King [name]'s Date: 10/13/2013 Reason for consult: Follow-up assessment   Maternal Data    Feeding Feeding Type: Breast Fed Length of feed: 14 min  LATCH Score/Interventions Latch: Grasps breast easily, tongue down, lips flanged, rhythmical sucking.  Audible Swallowing: Spontaneous and intermittent  Type of Nipple: Everted at rest and after stimulation  Comfort (Breast/Nipple): Filling, red/small blisters or bruises, mild/mod discomfort  Problem noted: Mild/Moderate discomfort Interventions (Mild/moderate discomfort): Comfort gels  Hold (Positioning): No assistance needed to correctly position infant at breast. Intervention(s): Skin to skin  LATCH Score: 9  Lactation Tools Discussed/Used     Consult Status Consult Status: Complete Follow-up type: In-patient    Octavio MannsSanders, Divina Neale [facility] 10/13/2013, 2:11 PM

## 2013-10-14 LAB — TYPE AND SCREEN
ABO/RH(D): O NEG
ANTIBODY SCREEN: POSITIVE
DAT, IGG: NEGATIVE
UNIT DIVISION: 0
Unit division: 0

## 2013-11-21 ENCOUNTER — Encounter: Payer: Self-pay | Admitting: Women's Health

## 2013-11-21 ENCOUNTER — Ambulatory Visit (INDEPENDENT_AMBULATORY_CARE_PROVIDER_SITE_OTHER): Payer: Medicaid Other | Admitting: Women's Health

## 2013-11-21 DIAGNOSIS — Z6791 Unspecified blood type, Rh negative: Secondary | ICD-10-CM

## 2013-11-21 DIAGNOSIS — Z3009 Encounter for other general counseling and advice on contraception: Secondary | ICD-10-CM

## 2013-11-21 DIAGNOSIS — Z3202 Encounter for pregnancy test, result negative: Secondary | ICD-10-CM

## 2013-11-21 LAB — POCT URINE PREGNANCY: PREG TEST UR: NEGATIVE

## 2013-11-21 MED ORDER — NORETHIN-ETH ESTRAD-FE BIPHAS 1 MG-10 MCG / 10 MCG PO TABS: 1.0000 | ORAL_TABLET | Freq: Every day | ORAL | Status: DC

## 2013-11-21 NOTE — Patient Instructions (Signed)
Oral Contraception Use Oral contraceptive pills (OCPs) are medicines taken to prevent pregnancy. OCPs work by preventing the ovaries from releasing eggs. The hormones in OCPs also cause the cervical mucus to thicken, preventing the sperm from entering the uterus. The hormones also cause the uterine lining to become thin, not allowing a fertilized egg to attach to the inside of the uterus. OCPs are highly effective when taken exactly as prescribed. However, OCPs do not prevent sexually transmitted diseases (STDs). Safe sex practices, such as using condoms along with an OCP, can help prevent STDs. Before taking OCPs, you may have a physical exam and Pap test. Your health care provider may also order blood tests if necessary. Your health care provider will make sure you are a good candidate for oral contraception. Discuss with your health care provider the possible side effects of the OCP you may be prescribed. When starting an OCP, it can take 2 to 3 months for the body to adjust to the changes in hormone levels in your body.  HOW TO TAKE ORAL CONTRACEPTIVE PILLS Your health care provider may advise you on how to start taking the first cycle of OCPs. Otherwise, you can:   Start on day 1 of your menstrual period. You will not need any backup contraceptive protection with this start time.   Start on the first Sunday after your menstrual period or the day you get your prescription. In these cases, you will need to use backup contraceptive protection for the first week.   Start the pill at any time of your cycle. If you take the pill within 5 days of the start of your period, you are protected against pregnancy right away. In this case, you will not need a backup form of birth control. If you start at any other time of your menstrual cycle, you will need to use another form of birth control for 7 days. If your OCP is the type called a minipill, it will protect you from pregnancy after taking it for 2 days (48  hours). After you have started taking OCPs:   If you forget to take 1 pill, take it as soon as you remember. Take the next pill at the regular time.   If you miss 2 or more pills, call your health care provider because different pills have different instructions for missed doses. Use backup birth control until your next menstrual period starts.   If you use a 28-day pack that contains inactive pills and you miss 1 of the last 7 pills (pills with no hormones), it will not matter. Throw away the rest of the non-hormone pills and start a new pill pack.  No matter which day you start the OCP, you will always start a new pack on that same day of the week. Have an extra pack of OCPs and a backup contraceptive method available in case you miss some pills or lose your OCP pack.  HOME CARE INSTRUCTIONS   Do not smoke.   Always use a condom to protect against STDs. OCPs do not protect against STDs.   Use a calendar to mark your menstrual period days.   Read the information and directions that came with your OCP. Talk to your health care provider if you have questions.  SEEK MEDICAL CARE IF:   You develop nausea and vomiting.   You have abnormal vaginal discharge or bleeding.   You develop a rash.   You miss your menstrual period.   You are losing   your hair.   You need treatment for mood swings or depression.   You get dizzy when taking the OCP.   You develop acne from taking the OCP.   You become pregnant.  SEEK IMMEDIATE MEDICAL CARE IF:   You develop chest pain.   You develop shortness of breath.   You have an uncontrolled or severe headache.   You develop numbness or slurred speech.   You develop visual problems.   You develop pain, redness, and swelling in the legs.  Document Released: 03/20/2011 Document Revised: 08/15/2013 Document Reviewed: 09/19/2012 Exit[employer] Patient Information 2015 [employer]. This information is not intended to replace  advice given to you by your health care provider. Make sure you discuss any questions you have with your health care provider.  

## 2013-11-21 NOTE — Progress Notes (Signed)
Patient ID: Jodi King Kroeger, female   DOB: 02-26-1991, 23 y.o.   MRN: [id number] Subjective:    Jodi King is a 23 y.o. 751P1001 Caucasian female who presents for a postpartum visit. She is 6 weeks postpartum following a spontaneous vaginal delivery at 38+ gestational weeks. Anesthesia: epidural. Episiotomy d/t decels. I have fully reviewed the prenatal and intrapartum course. Postpartum course has been uncomplicated. Baby's course has been uncomplicated. Baby is feeding by breast x 2wks- then decreased supply, so switched to bottle. Bleeding no bleeding. Bowel function is normal. Bladder function is normal. Patient is sexually active. Last sexual activity: 8/7. Contraception method is condoms and wants to start COCs. Smokes 1pack q 2-3wks, no h/o HTN, DVT/PE, CVA, MI, or migraines w/ aura. Postpartum depression screening: negative. Score 0.  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  The following portions of the patient's history were reviewed and updated as appropriate: allergies, current medications, past medical history, past surgical history and problem list.  Review of Systems Pertinent items are noted in HPI.   Filed Vitals:   11/21/13 1422  BP: 128/76  Height: 5' (1.524 m)  Weight: 183 lb (83.008 kg)   No LMP recorded.  Objective:   General:  alert, cooperative and no distress   Breasts:  deferred, no complaints  Lungs: clear to auscultation bilaterally  Heart:  regular rate and rhythm  Abdomen: soft, nontender   Vulva: normal  Vagina: normal vagina  Cervix:  closed  Corpus: Well-involuted  Adnexa:  Non-palpable  Rectal Exam: No hemorrhoids        Assessment:   Postpartum exam 6 wks s/p SVD Bottlefeeding Depression screening Contraception counseling  ASCUS pap w/ +HRHPV and HPV atypia on colpo during pregnancy Plan:   Contraception: rx Lo Loestrin w/ 11RF, condoms as back up x  2wks Follow up in: 2 weeks for repeat colpo w/ LHE or earlier if needed  Marge DuncansBooker, Tyrrell Stephens Randall CNM, WHNP-BC 11/21/2013 3:01 PM

## 2013-11-29 ENCOUNTER — Ambulatory Visit (INDEPENDENT_AMBULATORY_CARE_PROVIDER_SITE_OTHER): Payer: Medicaid Other | Admitting: Obstetrics & Gynecology

## 2013-11-29 ENCOUNTER — Encounter: Payer: Self-pay | Admitting: Obstetrics & Gynecology

## 2013-11-29 VITALS — BP 130/80 | Temp 99.1°F | Wt 183.0 lb

## 2013-11-29 DIAGNOSIS — J020 Streptococcal pharyngitis: Secondary | ICD-10-CM

## 2013-11-29 MED ORDER — PENICILLIN V POTASSIUM 500 MG PO TABS: 500.0000 mg | ORAL_TABLET | Freq: Four times a day (QID) | ORAL | Status: DC

## 2013-11-29 NOTE — Progress Notes (Signed)
Patient ID: Jodi King, female   DOB: 1990/09/25, 23 y.o.   MRN: [id number] Chief Complaint  Patient presents with  . Fever    c/c sore throat/  little cough.    HPI 1 day history of fever body aches and sore throat  ROS No burning with urination, frequency or urgency No nausea, vomiting or diarrhea Nor fever chills or other constitutional symptoms   Blood pressure 130/80, temperature 99.1 F (37.3 C), weight 183 lb (83.008 kg), not currently breastfeeding.  EXAM Throat +tender lymph nodes + white exudate      Assessment/Plan:  Strep pharyngitis  Pen VK x 10- days

## 2013-12-12 ENCOUNTER — Ambulatory Visit (INDEPENDENT_AMBULATORY_CARE_PROVIDER_SITE_OTHER): Payer: Medicaid Other | Admitting: Obstetrics & Gynecology

## 2013-12-12 ENCOUNTER — Encounter: Payer: Self-pay | Admitting: Obstetrics & Gynecology

## 2013-12-12 ENCOUNTER — Other Ambulatory Visit: Payer: Self-pay | Admitting: Obstetrics & Gynecology

## 2013-12-12 VITALS — BP 128/90 | Wt 182.0 lb

## 2013-12-12 DIAGNOSIS — N870 Mild cervical dysplasia: Secondary | ICD-10-CM | POA: Insufficient documentation

## 2013-12-12 NOTE — Progress Notes (Signed)
Patient ID: Jodi King, female   DOB: 1990-06-21, 23 y.o.   MRN: [id number] Colposcopy Procedure Note:  Pap performed:  05/2013 while pregnant Result:  ASCUS + HPV Smoker:  No. New sexual partner:  No.    History of abnormal Pap: no  Due to the indications listed above, a thorough colposcopic evaluation of the cervix and upper vagina was performed in the usual fashion using 3% acetic acid.  The findings of the visual inspection are noted below:  adequate Acetowhite changes       positive Punctation                      positive Mosaicism                      negative Abnormal Vessels          negative  Biopsy performed:          Yes.    Complications: no   Impression: LSIL  Recommendation: Follow up 1 week      Past Medical History  Diagnosis Date  . Chronic kidney disease     kidney stones  . Miscarriage   . Seasonal allergies 07/20/2013  . Kidney stones     Past Surgical History  Procedure Laterality Date  . Fracture surgery    . Femur fracture surgery      OB History   Grav Para Term Preterm Abortions TAB SAB Ect Mult Living   0  0   1      No Known Allergies  History   Social History  . Marital Status: Single    Spouse Name: N/A    Number of Children: N/A  . Years of Education: N/A   Social History Main Topics  . Smoking status: Former Smoker -- 0.50 packs/day for 4 years    Types: Cigarettes    Quit date: 08/09/2011  . Smokeless tobacco: Never Used  . Alcohol Use: Yes     Comment: not now  . Drug Use: No  . Sexual Activity: Not Currently    Birth Control/ Protection: None   Other Topics Concern  . None   Social History Narrative  . None    Family History  Problem Relation Age of Onset  . Hypertension Mother   . Nephrolithiasis Mother

## 2013-12-12 NOTE — Addendum Note (Signed)
Addended by: Criss Alvine on: 12/12/2013 12:09 PM   Modules accepted: Orders

## 2013-12-21 ENCOUNTER — Ambulatory Visit: Payer: Medicaid Other | Admitting: Obstetrics & Gynecology

## 2014-02-13 ENCOUNTER — Encounter: Payer: Self-pay | Admitting: Obstetrics & Gynecology

## 2019-05-10 ENCOUNTER — Ambulatory Visit (INDEPENDENT_AMBULATORY_CARE_PROVIDER_SITE_OTHER): Payer: Medicaid - Out of State | Admitting: Adult Health

## 2019-05-10 ENCOUNTER — Other Ambulatory Visit: Payer: Self-pay

## 2019-05-10 ENCOUNTER — Encounter: Payer: Self-pay | Admitting: Adult Health

## 2019-05-10 VITALS — BP 120/73 | HR 83 | Ht 60.0 in | Wt 196.5 lb

## 2019-05-10 DIAGNOSIS — O3680X0 Pregnancy with inconclusive fetal viability, not applicable or unspecified: Secondary | ICD-10-CM

## 2019-05-10 DIAGNOSIS — Z3201 Encounter for pregnancy test, result positive: Secondary | ICD-10-CM

## 2019-05-10 DIAGNOSIS — Z3A11 11 weeks gestation of pregnancy: Secondary | ICD-10-CM

## 2019-05-10 LAB — POCT URINE PREGNANCY: Preg Test, Ur: POSITIVE — AB

## 2019-05-10 NOTE — Progress Notes (Signed)
  Subjective:     Patient ID: Tyreshia Ingman, female   DOB: 1990/11/06, 29 y.o.   MRN: [id number]  HPI Evonda is a 29 year old white female, married, in for UPT.She had nexplanon removed when on period in November and that was her last period.She has had 1+HPT   Review of Systems +missed periods 1+HPT +cramping, denies bleeding, nausea or vomiting Reviewed past medical,surgical, social and family history. Reviewed medications and allergies.     Objective:   Physical Exam BP 120/73 (BP Location: Left Arm, Patient Position: Sitting, Cuff Size: Normal)   Pulse 83   Ht 5' (1.524 m)   Wt 196 lb 8 oz (89.1 kg)   LMP 02/16/2019 (Within Days)   BMI 38.38 kg/m UPT +, about 11+6 weeks by LMP,with EDD 11/23/19.Skin warm and dry. Neck: mid line trachea, normal thyroid, good ROM, no lymphadenopathy noted. Lungs: clear to ausculation bilaterally. Cardiovascular: regular rate and rhythm.Abdomen is soft and non tender. Fall risk is low     Assessment:     1. Pregnancy test positive Continue PNV  2. [redacted] weeks gestation of pregnancy  3. Encounter to determine fetal viability of pregnancy, single or unspecified fetus Dating [place] tomorrow    Plan:     Review handout by [facility]

## 2019-05-11 ENCOUNTER — Ambulatory Visit (INDEPENDENT_AMBULATORY_CARE_PROVIDER_SITE_OTHER): Payer: Medicaid - Out of State

## 2019-05-11 ENCOUNTER — Other Ambulatory Visit: Payer: Self-pay

## 2019-05-11 DIAGNOSIS — O3680X0 Pregnancy with inconclusive fetal viability, not applicable or unspecified: Secondary | ICD-10-CM

## 2019-05-11 DIAGNOSIS — Z3A12 12 weeks gestation of pregnancy: Secondary | ICD-10-CM

## 2019-05-11 NOTE — Progress Notes (Signed)
[place] 5 wks GS w/ys,no fetal pole visualized,GS 3.5 mm,normal ovaries,pt will come back for f/u ultrasound in 2 wks

## 2019-05-23 ENCOUNTER — Other Ambulatory Visit: Payer: Self-pay | Admitting: Adult Health

## 2019-05-23 DIAGNOSIS — O3680X0 Pregnancy with inconclusive fetal viability, not applicable or unspecified: Secondary | ICD-10-CM

## 2019-05-24 ENCOUNTER — Other Ambulatory Visit: Payer: Self-pay

## 2019-05-24 ENCOUNTER — Ambulatory Visit (INDEPENDENT_AMBULATORY_CARE_PROVIDER_SITE_OTHER): Payer: Medicaid - Out of State

## 2019-05-24 DIAGNOSIS — Z3A13 13 weeks gestation of pregnancy: Secondary | ICD-10-CM | POA: Diagnosis not present

## 2019-05-24 DIAGNOSIS — O3680X0 Pregnancy with inconclusive fetal viability, not applicable or unspecified: Secondary | ICD-10-CM | POA: Diagnosis not present

## 2019-05-24 NOTE — Progress Notes (Signed)
[place] 6 wks,single IUP with ys,borderline enlarged yolk sac 5.6 mm,fhr 100 bpm,crl 3.93 mm,f/u ultrasound in 10 days per Victorino Dike

## 2019-06-06 ENCOUNTER — Other Ambulatory Visit: Payer: Self-pay | Admitting: Adult Health

## 2019-06-06 DIAGNOSIS — O3680X0 Pregnancy with inconclusive fetal viability, not applicable or unspecified: Secondary | ICD-10-CM

## 2019-06-07 ENCOUNTER — Encounter: Payer: Self-pay | Admitting: Obstetrics and Gynecology

## 2019-06-07 ENCOUNTER — Other Ambulatory Visit: Payer: Self-pay | Admitting: Adult Health

## 2019-06-07 ENCOUNTER — Telehealth: Payer: Self-pay | Admitting: Obstetrics and Gynecology

## 2019-06-07 ENCOUNTER — Ambulatory Visit (INDEPENDENT_AMBULATORY_CARE_PROVIDER_SITE_OTHER): Payer: Medicaid - Out of State

## 2019-06-07 ENCOUNTER — Other Ambulatory Visit: Payer: Self-pay

## 2019-06-07 ENCOUNTER — Ambulatory Visit (INDEPENDENT_AMBULATORY_CARE_PROVIDER_SITE_OTHER): Payer: Medicaid - Out of State | Admitting: Obstetrics and Gynecology

## 2019-06-07 VITALS — BP 137/81 | HR 107 | Ht 60.0 in | Wt 196.2 lb

## 2019-06-07 DIAGNOSIS — Z3A08 8 weeks gestation of pregnancy: Secondary | ICD-10-CM

## 2019-06-07 DIAGNOSIS — O3680X0 Pregnancy with inconclusive fetal viability, not applicable or unspecified: Secondary | ICD-10-CM

## 2019-06-07 DIAGNOSIS — O021 Missed abortion: Secondary | ICD-10-CM

## 2019-06-07 MED ORDER — MISOPROSTOL 200 MCG PO TABS: ORAL_TABLET | ORAL | 1 refills | Status: DC

## 2019-06-07 NOTE — Telephone Encounter (Signed)
Called pt in followup of dx of missed abortion, left message, pt encouraged to call back Pt reminded that with Rh negative blood, Rhogam will be needed.at time of miscarriage or bleeding.

## 2019-06-07 NOTE — Progress Notes (Signed)
[place] 6 +1 wks fetal pole,no fetal heart tones visualized,crl 4.8 mm,normal left ovary,complex right corpus luteal cyst 2.3 x 2.1 x 1.9 cm,Dr Emelda Fear reviewed images during ultrasound

## 2019-06-07 NOTE — Progress Notes (Signed)
   [facility] Clinic Visit  @DATE @            Patient name: Jodi King  Date of birth: 01-Jun-1990  CC & HPI:  Jodi King is a 29 y.o. female presenting today for evaluation of ultrasound for missed AB Patient was returning for a follow-up ultrasound due to fetal bradycardia and upper limits normal size yolk sac on initial dating ultrasound.  At that time the fetal pole was 5 mm, its been 2 weeks and fetal pole length remains unchanged and fetal cardiac activity is now absent ROS:  ROS Patient is having no bleeding.  Blood type O- by old records  Pertinent History Reviewed:   Reviewed: Significant for no prior pregnancy complications Medical         Past Medical History:  Diagnosis Date  . Chronic kidney disease    kidney stones  . Kidney stones   . Miscarriage   . Seasonal allergies 07/20/2013                              Surgical Hx:    Past Surgical History:  Procedure Laterality Date  . FEMUR FRACTURE SURGERY    . FRACTURE SURGERY     Medications: Reviewed & Updated - see associated section                       Current Outpatient Medications:  .  Prenatal Vit-Fe Fumarate-FA (PRENATAL MULTIVITAMIN) TABS tablet, Take 1 tablet by mouth daily at 12 noon., Disp: , Rfl:    Social History: Reviewed -  reports that she quit smoking about 7 years ago. Her smoking use included cigarettes. She has a 2.00 pack-year smoking history. She has never used smokeless tobacco.  Objective Findings:  Vitals: Blood pressure 137/81, pulse (!) 107, height 5' (1.524 m), weight 196 lb 3.2 oz (89 kg), last menstrual period 02/16/2019.  PHYSICAL EXAMINATION General appearance - alert, well appearing, and in no distress, oriented to person, place, and time and normal appearing weight Mental status - alert, oriented to person, place, and time, normal mood, behavior, speech, dress, motor activity, and thought processes Chest - not examined Heart -, normal rate and regular  rhythm Abdomen -  Breasts -  Skin -   PELVIC TECHNICIAN COMMENTS:  13/07/2018 6 +1 wks fetal pole,no fetal heart tones visualized,crl 4.8 mm,normal left ovary,complex right corpus luteal cyst 2.3 x 2.1 x 1.9 cm,Dr [place] reviewed images during ultrasound      Assessment & Plan:   A:  1.  missed Abortion  2. Rh negative.  P:  1.  discussed options, will follow til starts bleeding, then use Cytotec 800 mcg per vagina, 2. Pt to come back in for Rhogam at onset of bleeding. 3. Offered to speak with husband this pm if he has questions.

## 2019-06-20 ENCOUNTER — Telehealth: Payer: Self-pay | Admitting: Obstetrics and Gynecology

## 2019-06-20 NOTE — Telephone Encounter (Signed)
Jodi King calls, reporting that she is just began to spot lightly.  She has a known missed abortion and has the prescription for Cytotec pills already called to her pharmacy.  She is Rh-, and will be scheduled to come in tomorrow for her RhoGam shot.  She will take the Cytotec 800 mcg per vagina tonight, repeat if needed in 24 to 48 hours and follow-up after that

## 2019-06-21 ENCOUNTER — Encounter: Payer: Self-pay | Admitting: *Deleted

## 2019-06-21 ENCOUNTER — Ambulatory Visit (INDEPENDENT_AMBULATORY_CARE_PROVIDER_SITE_OTHER): Payer: Medicaid - Out of State | Admitting: *Deleted

## 2019-06-21 ENCOUNTER — Other Ambulatory Visit: Payer: Self-pay

## 2019-06-21 VITALS — BP 130/81 | HR 101 | Ht 60.0 in | Wt 196.0 lb

## 2019-06-21 DIAGNOSIS — O021 Missed abortion: Secondary | ICD-10-CM

## 2019-06-21 DIAGNOSIS — Z6791 Unspecified blood type, Rh negative: Secondary | ICD-10-CM

## 2019-06-21 NOTE — Progress Notes (Addendum)
   NURSE VISIT- INJECTION  SUBJECTIVE:  Jodi King is a 29 y.o. G26P1001 female here for a Rhophylac for per provider order. She is a GYN patient.   OBJECTIVE:  BP 130/81 (BP Location: Left Arm, Patient Position: Sitting, Cuff Size: Normal)   Pulse (!) 101   Ht 5' (1.524 m)   Wt 196 lb (88.9 kg)   LMP 02/16/2019 (Within Days)   BMI 38.28 kg/m   Appears well, in no apparent distress  Injection administered in: Left upper quad. gluteus  No orders of the defined types were placed in this encounter.   ASSESSMENT: GYN patient Rhophylac for Rh neg status during pregnancy PLAN: Follow-up: as scheduled   Stoney Bang  06/21/2019 10:19 AM   Attestation of Attending Supervision of Nursing Visit Encounter: Evaluation and management procedures were performed by the nursing staff under my supervision and collaboration.  I have reviewed the nurse's note and chart, and I agree with the management and plan.  Rockne Coons MD Attending Physician for the [facility] 09/27/2019 6:55 AM

## 2020-02-28 ENCOUNTER — Other Ambulatory Visit: Payer: Medicaid - Out of State

## 2020-03-12 ENCOUNTER — Encounter: Payer: Self-pay | Admitting: *Deleted

## 2020-03-14 ENCOUNTER — Ambulatory Visit (INDEPENDENT_AMBULATORY_CARE_PROVIDER_SITE_OTHER): Payer: Medicaid - Out of State | Admitting: *Deleted

## 2020-03-14 ENCOUNTER — Other Ambulatory Visit: Payer: Self-pay

## 2020-03-14 VITALS — BP 132/83 | HR 91 | Ht 61.0 in | Wt 204.0 lb

## 2020-03-14 DIAGNOSIS — Z3201 Encounter for pregnancy test, result positive: Secondary | ICD-10-CM

## 2020-03-14 LAB — POCT URINE PREGNANCY: Preg Test, Ur: POSITIVE — AB

## 2020-03-14 NOTE — Addendum Note (Signed)
Addended by: Annamarie Dawley on: 03/14/2020 10:08 AM   Modules accepted: Level of Service

## 2020-03-14 NOTE — Progress Notes (Addendum)
   NURSE VISIT- PREGNANCY CONFIRMATION   SUBJECTIVE:  Jodi King is a 29 y.o. G73P1001 female at [redacted]w[redacted]d by certain LMP of Patient's last menstrual period was 01/04/2020 (exact date). Here for pregnancy confirmation.  Home pregnancy test: positive x 2  She reports nausea.  She is taking prenatal vitamins.    OBJECTIVE:  BP 132/83 (BP Location: Left Arm, Patient Position: Sitting, Cuff Size: Normal)   Pulse 91   Ht 5\' 1"  (1.549 m)   Wt 204 lb (92.5 kg)   LMP 01/04/2020 (Exact Date)   BMI 38.55 kg/m   Appears well, in no apparent distress OB History  Gravida Para Term Preterm AB Living  3 1 1    0 1  SAB TAB Ectopic Multiple Live Births  0       1    # Outcome Date GA Lbr Len/2nd Weight Sex Delivery Anes PTL Lv  3 Current           2 Term 10/11/13 [redacted]w[redacted]d 21:15 / 00:59 7 lb 0.4 oz (3.187 kg) M Vag-Spont EPI  LIV  1 Gravida             Results for orders placed or performed in visit on 03/14/20 (from the past 24 hour(s))  POCT urine pregnancy   Collection Time: 03/14/20  9:55 AM  Result Value Ref Range   Preg Test, Ur Positive (A) Negative    ASSESSMENT: Positive pregnancy test, [redacted]w[redacted]d by LMP    PLAN: Schedule for dating ultrasound in next  opening Prenatal vitamins: continue   Nausea medicines: not currently needed   OB packet given: Yes  14/01/21  03/14/2020 10:07 AM

## 2020-03-14 NOTE — Progress Notes (Signed)
Chart reviewed for nurse visit. Agree with plan of care.  Adline Potter, NP 03/14/2020 10:14 AM

## 2020-03-15 ENCOUNTER — Other Ambulatory Visit: Payer: Self-pay | Admitting: Obstetrics & Gynecology

## 2020-03-15 DIAGNOSIS — O3680X0 Pregnancy with inconclusive fetal viability, not applicable or unspecified: Secondary | ICD-10-CM

## 2020-03-16 ENCOUNTER — Ambulatory Visit (INDEPENDENT_AMBULATORY_CARE_PROVIDER_SITE_OTHER): Payer: Medicaid - Out of State

## 2020-03-16 ENCOUNTER — Other Ambulatory Visit: Payer: Self-pay

## 2020-03-16 DIAGNOSIS — O3680X0 Pregnancy with inconclusive fetal viability, not applicable or unspecified: Secondary | ICD-10-CM | POA: Diagnosis not present

## 2020-03-16 DIAGNOSIS — Z3A10 10 weeks gestation of pregnancy: Secondary | ICD-10-CM | POA: Diagnosis not present

## 2020-03-16 NOTE — Progress Notes (Signed)
[place] 10+2 wks,single IUP with YS, positive fht 164 bpm,crl 35.06 mm,normal ovaries

## 2020-03-27 ENCOUNTER — Other Ambulatory Visit: Payer: Self-pay | Admitting: Obstetrics & Gynecology

## 2020-03-27 DIAGNOSIS — Z3682 Encounter for antenatal screening for nuchal translucency: Secondary | ICD-10-CM

## 2020-03-28 ENCOUNTER — Ambulatory Visit (INDEPENDENT_AMBULATORY_CARE_PROVIDER_SITE_OTHER): Payer: Medicaid - Out of State

## 2020-03-28 ENCOUNTER — Other Ambulatory Visit: Payer: Medicaid - Out of State

## 2020-03-28 ENCOUNTER — Other Ambulatory Visit: Payer: Self-pay

## 2020-03-28 DIAGNOSIS — Z1379 Encounter for other screening for genetic and chromosomal anomalies: Secondary | ICD-10-CM

## 2020-03-28 DIAGNOSIS — Z3682 Encounter for antenatal screening for nuchal translucency: Secondary | ICD-10-CM | POA: Diagnosis not present

## 2020-03-28 NOTE — Progress Notes (Signed)
[place] 12 wks,measurements c/w dates,crl 53.77 mm,NB present,NT 1.2 mm,fhr 165 bpm,posterior placenta,normal ovaries

## 2020-03-30 LAB — INTEGRATED 1
Crown Rump Length: 53.8 mm
Gest. Age on Collection Date: 11.9 weeks
Maternal Age at EDD: 29.2 yr
Nuchal Translucency (NT): 1.2 mm
Number of Fetuses: 1
PAPP-A Value: 419.1 ng/mL
Weight: 203 [lb_av]

## 2020-04-11 ENCOUNTER — Encounter: Payer: Self-pay | Admitting: Advanced Practice Midwife

## 2020-04-11 DIAGNOSIS — O0990 Supervision of high risk pregnancy, unspecified, unspecified trimester: Secondary | ICD-10-CM | POA: Insufficient documentation

## 2020-04-11 DIAGNOSIS — O26899 Other specified pregnancy related conditions, unspecified trimester: Secondary | ICD-10-CM | POA: Insufficient documentation

## 2020-04-12 ENCOUNTER — Other Ambulatory Visit: Payer: Self-pay

## 2020-04-12 ENCOUNTER — Ambulatory Visit (INDEPENDENT_AMBULATORY_CARE_PROVIDER_SITE_OTHER): Payer: Medicaid - Out of State | Admitting: Advanced Practice Midwife

## 2020-04-12 ENCOUNTER — Ambulatory Visit: Payer: Medicaid - Out of State | Admitting: *Deleted

## 2020-04-12 ENCOUNTER — Other Ambulatory Visit (HOSPITAL_COMMUNITY)
Admission: RE | Admit: 2020-04-12 | Discharge: 2020-04-12 | Disposition: A | Discharge status: Home / Self Care | Payer: Medicaid - Out of State | Source: Ambulatory Visit | Attending: Advanced Practice Midwife | Admitting: Advanced Practice Midwife

## 2020-04-12 VITALS — BP 122/77 | HR 90 | Wt 206.0 lb

## 2020-04-12 DIAGNOSIS — O26899 Other specified pregnancy related conditions, unspecified trimester: Secondary | ICD-10-CM

## 2020-04-12 DIAGNOSIS — Z3480 Encounter for supervision of other normal pregnancy, unspecified trimester: Secondary | ICD-10-CM | POA: Insufficient documentation

## 2020-04-12 DIAGNOSIS — Z124 Encounter for screening for malignant neoplasm of cervix: Secondary | ICD-10-CM | POA: Insufficient documentation

## 2020-04-12 DIAGNOSIS — Z1379 Encounter for other screening for genetic and chromosomal anomalies: Secondary | ICD-10-CM

## 2020-04-12 DIAGNOSIS — Z6791 Unspecified blood type, Rh negative: Secondary | ICD-10-CM

## 2020-04-12 DIAGNOSIS — Z363 Encounter for antenatal screening for malformations: Secondary | ICD-10-CM

## 2020-04-12 MED ORDER — OMEPRAZOLE 20 MG PO CPDR: 20.0000 mg | DELAYED_RELEASE_CAPSULE | Freq: Every day | ORAL | 6 refills | Status: DC

## 2020-04-12 MED ORDER — ONDANSETRON 4 MG PO TBDP: 4.0000 mg | ORAL_TABLET | Freq: Four times a day (QID) | ORAL | 2 refills | Status: DC | PRN

## 2020-04-12 NOTE — Progress Notes (Signed)
INITIAL OBSTETRICAL VISIT Patient name: Jodi King MRN [id number]  Date of birth: 18-Aug-1990 Chief Complaint:   Initial Prenatal Visit  History of Present Illness:   Jodi King is a 29 y.o. G77P1011  female at [redacted]w[redacted]d by LMP c/w u/s at 10 weeks with an Estimated Date of Delivery: 10/10/20 being seen today for her initial obstetrical visit.   Her obstetrical history is significant for term SVD w/o problems, 1 early SAB.Marland Kitchen   Today she reports heartburn.  Depression screen [facility] 2/9 04/12/2020  Decreased Interest 0  Down, Depressed, Hopeless 0  PHQ - 2 Score 0  Altered sleeping 0  Tired, decreased energy 1  Change in appetite 0  Feeling bad or failure about yourself  0  Trouble concentrating 0  Moving slowly or fidgety/restless 0  Suicidal thoughts 0  PHQ-9 Score 1    Patient's last menstrual period was 01/04/2020 (exact date). Last pap 2021 in [place], [place] Results were:Normal Pt unsure why she was called back for one in 10/21--will collect today.  Review of Systems:   Pertinent items are noted in HPI Denies cramping/contractions, leakage of fluid, vaginal bleeding, abnormal vaginal discharge w/ itching/odor/irritation, headaches, visual changes, shortness of breath, chest pain, abdominal pain, severe nausea/vomiting, or problems with urination or bowel movements unless otherwise stated above.  Pertinent History Reviewed:  Reviewed past medical,surgical, social, obstetrical and family history.  Reviewed problem list, medications and allergies. OB History  Gravida Para Term Preterm AB Living  3 1 1   1 1   SAB IAB Ectopic Multiple Live Births  1       1    # Outcome Date GA Lbr Len/2nd Weight Sex Delivery Anes PTL Lv  3 Current           2 SAB 06/07/19 [redacted]w[redacted]d         1 Term 10/11/13 [redacted]w[redacted]d 21:15 / 00:59 7 lb 0.4 oz (3.187 kg) M Vag-Spont EPI  LIV   Physical Assessment:   Vitals:   04/12/20 0911  BP: 122/77  Pulse: 90  Weight: 206 lb (93.4 kg)  Body mass index is 38.92  kg/m.       Physical Examination:  General appearance - well appearing, and in no distress  Mental status - alert, oriented to person, place, and time  Psych:  She has a normal mood and affect  Skin - warm and dry, normal color, no suspicious lesions noted  Chest - effort normal  Heart - normal rate and regular rhythm  Abdomen - soft, nontender  Extremities:  No swelling or varicosities noted  Pelvic - VULVA: normal appearing vulva with no masses, tenderness or lesions  VAGINA: normal appearing vagina with normal color and discharge, no lesions  CERVIX: normal appearing cervix without discharge or lesions, no CMT  Thin prep pap is done with HR HPV cotesting  03/28/20 NT 03/30/20 12 wks,measurements c/w dates,crl 53.77 mm,NB present,NT 1.2 mm,fhr 165 bpm,posterior placenta,normal ovaries  No results found for this or any previous visit (from the past 24 hour(s)).  Assessment & Plan:  1) Low-Risk Pregnancy G3P1011 at [redacted]w[redacted]d with an Estimated Date of Delivery: 10/10/20   2) Initial OB visit  3) Heartburn: rx prilosec  Meds:  Meds ordered this encounter  Medications  . omeprazole (PRILOSEC) 20 MG capsule    Sig: Take 1 capsule (20 mg total) by mouth daily.    Dispense:  30 capsule    Refill:  6    Order Specific Question:  Supervising Provider    Answer:   Duane Lope H [[id number]]  . ondansetron (ZOFRAN ODT) 4 MG disintegrating tablet    Sig: Take 1 tablet (4 mg total) by mouth every 6 (six) hours as needed for nausea.    Dispense:  30 tablet    Refill:  2    Order Specific Question:   Supervising Provider    Answer:   Duane Lope H [[id number]]    Initial labs obtained Continue prenatal vitamins Reviewed n/v relief measures and warning s/s to report Reviewed recommended weight gain based on pre-gravid BMI Encouraged well-balanced diet Genetic & carrier screening discussed: requests Panorama, NT/IT and Horizon 14 , declines AFP Ultrasound discussed; fetal survey: requested CCNC  completed> form faxed if has or is planning to apply for medicaid The nature of [place] [facility] [employer] with multiple MDs and other Advanced Practice Providers was explained to patient; also emphasized that fellows, residents, and students are part of our team. Given a home bp cuff.Check bp weekly, let us know if >140/90.        Scarlette Calico Cresenzo-Dishmon 10:05 AM

## 2020-04-12 NOTE — Patient Instructions (Signed)
Jodi King, I greatly value your feedback.  If you receive a survey following your visit with [place] today, we appreciate you taking the time to fill it out.  Thanks, Cathie Beams, DNP, CNM  [facility] HAS MOVED!!! It is now [facility] ([address] [place], [place] [address]) Entrance located off of [address] Free 24/7 valet parking   Nausea & Vomiting  Have saltine crackers or pretzels by your bed and eat a few bites before you raise your head out of bed in the morning  Eat small frequent meals throughout the day instead of large meals  Drink plenty of fluids throughout the day to stay hydrated, just don't drink a lot of fluids with your meals.  This can make your stomach fill up faster making you feel sick  Do not brush your teeth right after you eat  Products with real ginger are good for nausea, like ginger ale and ginger hard candy Make sure it says made with real ginger!  Sucking on sour candy like lemon heads is also good for nausea  If your prenatal vitamins make you nauseated, take them at night so you will sleep through the nausea  Sea Bands  If you feel like you need medicine for the nausea & vomiting please let us know  If you are unable to keep any fluids or food down please let us know   Constipation  Drink plenty of fluid, preferably water, throughout the day  Eat foods high in fiber such as fruits, vegetables, and grains  Exercise, such as walking, is a good way to keep your bowels regular  Drink warm fluids, especially warm prune juice, or decaf coffee  Eat a 1/2 cup of real oatmeal (not instant), 1/2 cup applesauce, and 1/2-1 cup warm prune juice every day  If needed, you may take Colace (docusate sodium) stool softener once or twice a day to help keep the stool soft.   If you still are having problems with constipation, you may take Miralax once daily as needed to help keep your bowels regular.   Home  Blood Pressure Monitoring for Patients   Your provider has recommended that you check your blood pressure (BP) at least once a week at home. If you do not have a blood pressure cuff at home, one will be provided for you. Contact your provider if you have not received your monitor within 1 week.   Helpful Tips for Accurate Home Blood Pressure Checks  . Don't smoke, exercise, or drink caffeine 30 minutes before checking your BP . Use the restroom before checking your BP (a full bladder can raise your pressure) . Relax in a comfortable upright chair . Feet on the ground . Left arm resting comfortably on a flat surface at the level of your heart . Legs uncrossed . Back supported . Sit quietly and don't talk . Place the cuff on your bare arm . Adjust snuggly, so that only two fingertips can fit between your skin and the top of the cuff . Check 2 readings separated by at least one minute . Keep a log of your BP readings . For a visual, please reference this diagram: http://ccnc.care/bpdiagram  Provider Name: [facility]     Phone: [phone]  Zone 1: ALL CLEAR  Continue to monitor your symptoms:  . BP reading is less than 140 (top number) or less than 90 (bottom number)  . No right upper stomach pain .  No headaches or seeing spots . No feeling nauseated or throwing up . No swelling in face and hands  Zone 2: CAUTION Call your doctor's office for any of the following:  . BP reading is greater than 140 (top number) or greater than 90 (bottom number)  . Stomach pain under your ribs in the middle or right side . Headaches or seeing spots . Feeling nauseated or throwing up . Swelling in face and hands  Zone 3: EMERGENCY  Seek immediate medical care if you have any of the following:  . BP reading is greater than160 (top number) or greater than 110 (bottom number) . Severe headaches not improving with Tylenol . Serious difficulty catching your breath . Any worsening symptoms  from Zone 2    First Trimester of Pregnancy The first trimester of pregnancy is from week 1 until the end of week 12 (months 1 through 3). A week after a sperm fertilizes an egg, the egg will implant on the wall of the uterus. This embryo will begin to develop into a baby. Genes from you and your partner are forming the baby. The female genes determine whether the baby is a boy or a girl. At 6-8 weeks, the eyes and face are formed, and the heartbeat can be seen on ultrasound. At the end of 12 weeks, all the baby's organs are formed.  Now that you are pregnant, you will want to do everything you can to have a healthy baby. Two of the most important things are to get good prenatal care and to follow your health care provider's instructions. Prenatal care is all the medical care you receive before the baby's birth. This care will help prevent, find, and treat any problems during the pregnancy and childbirth. BODY CHANGES Your body goes through many changes during pregnancy. The changes vary from woman to woman.   You may gain or lose a couple of pounds at first.  You may feel sick to your stomach (nauseous) and throw up (vomit). If the vomiting is uncontrollable, call your health care provider.  You may tire easily.  You may develop headaches that can be relieved by medicines approved by your health care provider.  You may urinate more often. Painful urination may mean you have a bladder infection.  You may develop heartburn as a result of your pregnancy.  You may develop constipation because certain hormones are causing the muscles that push waste through your intestines to slow down.  You may develop hemorrhoids or swollen, bulging veins (varicose veins).  Your breasts may begin to grow larger and become tender. Your nipples may stick out more, and the tissue that surrounds them (areola) may become darker.  Your gums may bleed and may be sensitive to brushing and flossing.  Dark spots or  blotches (chloasma, mask of pregnancy) may develop on your face. This will likely fade after the baby is born.  Your menstrual periods will stop.  You may have a loss of appetite.  You may develop cravings for certain kinds of food.  You may have changes in your emotions from day to day, such as being excited to be pregnant or being concerned that something may go wrong with the pregnancy and baby.  You may have more vivid and strange dreams.  You may have changes in your hair. These can include thickening of your hair, rapid growth, and changes in texture. Some women also have hair loss during or after pregnancy, or hair that feels dry  or thin. Your hair will most likely return to normal after your baby is born. WHAT TO EXPECT AT YOUR PRENATAL VISITS During a routine prenatal visit:  You will be weighed to make sure you and the baby are growing normally.  Your blood pressure will be taken.  Your abdomen will be measured to track your baby's growth.  The fetal heartbeat will be listened to starting around week 10 or 12 of your pregnancy.  Test results from any previous visits will be discussed. Your health care provider may ask you:  How you are feeling.  If you are feeling the baby move.  If you have had any abnormal symptoms, such as leaking fluid, bleeding, severe headaches, or abdominal cramping.  If you have any questions. Other tests that may be performed during your first trimester include:  Blood tests to find your blood type and to check for the presence of any previous infections. They will also be used to check for low iron levels (anemia) and Rh antibodies. Later in the pregnancy, blood tests for diabetes will be done along with other tests if problems develop.  Urine tests to check for infections, diabetes, or protein in the urine.  An ultrasound to confirm the proper growth and development of the baby.  An amniocentesis to check for possible genetic  problems.  Fetal screens for spina bifida and Down syndrome.  You may need other tests to make sure you and the baby are doing well. HOME CARE INSTRUCTIONS  Medicines  Follow your health care provider's instructions regarding medicine use. Specific medicines may be either safe or unsafe to take during pregnancy.  Take your prenatal vitamins as directed.  If you develop constipation, try taking a stool softener if your health care provider approves. Diet  Eat regular, well-balanced meals. Choose a variety of foods, such as meat or vegetable-based protein, fish, milk and low-fat dairy products, vegetables, fruits, and whole grain breads and cereals. Your health care provider will help you determine the amount of weight gain that is right for you.  Avoid raw meat and uncooked cheese. These carry germs that can cause birth defects in the baby.  Eating four or five small meals rather than three large meals a day may help relieve nausea and vomiting. If you start to feel nauseous, eating a few soda crackers can be helpful. Drinking liquids between meals instead of during meals also seems to help nausea and vomiting.  If you develop constipation, eat more high-fiber foods, such as fresh vegetables or fruit and whole grains. Drink enough fluids to keep your urine clear or pale yellow. Activity and Exercise  Exercise only as directed by your health care provider. Exercising will help you:  Control your weight.  Stay in shape.  Be prepared for labor and delivery.  Experiencing pain or cramping in the lower abdomen or low back is a good sign that you should stop exercising. Check with your health care provider before continuing normal exercises.  Try to avoid standing for long periods of time. Move your legs often if you must stand in one place for a long time.  Avoid heavy lifting.  Wear low-heeled shoes, and practice good posture.  You may continue to have sex unless your health care  provider directs you otherwise. Relief of Pain or Discomfort  Wear a good support bra for breast tenderness.    Take warm sitz baths to soothe any pain or discomfort caused by hemorrhoids. Use hemorrhoid cream if your  health care provider approves.    Rest with your legs elevated if you have leg cramps or low back pain.  If you develop varicose veins in your legs, wear support hose. Elevate your feet for 15 minutes, 3-4 times a day. Limit salt in your diet. Prenatal Care  Schedule your prenatal visits by the twelfth week of pregnancy. They are usually scheduled monthly at first, then more often in the last 2 months before delivery.  Write down your questions. Take them to your prenatal visits.  Keep all your prenatal visits as directed by your health care provider. Safety  Wear your seat belt at all times when driving.  Make a list of emergency phone numbers, including numbers for family, friends, the hospital, and police and fire departments. General Tips  Ask your health care provider for a referral to a local prenatal education class. Begin classes no later than at the beginning of month 6 of your pregnancy.  Ask for help if you have counseling or nutritional needs during pregnancy. Your health care provider can offer advice or refer you to specialists for help with various needs.  Do not use hot tubs, steam rooms, or saunas.  Do not douche or use tampons or scented sanitary pads.  Do not cross your legs for long periods of time.  Avoid cat litter boxes and soil used by cats. These carry germs that can cause birth defects in the baby and possibly loss of the fetus by miscarriage or stillbirth.  Avoid all smoking, herbs, alcohol, and medicines not prescribed by your health care provider. Chemicals in these affect the formation and growth of the baby.  Schedule a dentist appointment. At home, brush your teeth with a soft toothbrush and be gentle when you floss. SEEK MEDICAL  CARE IF:   You have dizziness.  You have mild pelvic cramps, pelvic pressure, or nagging pain in the abdominal area.  You have persistent nausea, vomiting, or diarrhea.  You have a bad smelling vaginal discharge.  You have pain with urination.  You notice increased swelling in your face, hands, legs, or ankles. SEEK IMMEDIATE MEDICAL CARE IF:   You have a fever.  You are leaking fluid from your vagina.  You have spotting or bleeding from your vagina.  You have severe abdominal cramping or pain.  You have rapid weight gain or loss.  You vomit blood or material that looks like coffee grounds.  You are exposed to [place] measles and have never had them.  You are exposed to fifth disease or chickenpox.  You develop a severe headache.  You have shortness of breath.  You have any kind of trauma, such as from a fall or a car accident. Document Released: 03/25/2001 Document Revised: 08/15/2013 Document Reviewed: 02/08/2013 [facility] Patient Information 2015 [place], [place]. This information is not intended to replace advice given to you by your health care provider. Make sure you discuss any questions you have with your health care provider.  Coronavirus (COVID-19) Are you at risk?  Are you at risk for the Coronavirus (COVID-19)?  To be considered HIGH RISK for Coronavirus (COVID-19), you have to meet the following criteria:  . Traveled to [place], [place], [place], [place] or [place];  and have fever, cough, and shortness of breath within the last 2 weeks of travel OR . Been in close contact with a person diagnosed with COVID-19 within the last 2 weeks and have fever, cough, and shortness of breath . IF YOU DO NOT MEET  THESE CRITERIA, YOU ARE CONSIDERED LOW RISK FOR COVID-19.  What to do if you are HIGH RISK for COVID-19?  Marland Kitchen If you are having a medical emergency, call 911. . Seek medical care right away. Before you go to a doctor's office, urgent care or emergency department,  call ahead and tell them about your recent travel, contact with someone diagnosed with COVID-19, and your symptoms. You should receive instructions from your physician's office regarding next steps of care.  . When you arrive at healthcare provider, tell the healthcare staff immediately you have returned from visiting [place], [place], [place], [place] or [place]; in the last two weeks or you have been in close contact with a person diagnosed with COVID-19 in the last 2 weeks.   . Tell the health care staff about your symptoms: fever, cough and shortness of breath. . After you have been seen by a medical provider, you will be either: o Tested for (COVID-19) and discharged home on quarantine except to seek medical care if symptoms worsen, and asked to  - Stay home and avoid contact with others until you get your results (4-5 days)  - Avoid travel on public transportation if possible (such as bus, train, or airplane) or o Sent to the Emergency Department by EMS for evaluation, COVID-19 testing, and possible admission depending on your condition and test results.  What to do if you are LOW RISK for COVID-19?  Reduce your risk of any infection by using the same precautions used for avoiding the common cold or flu:  Marland Kitchen Wash your hands often with soap and warm water for at least 20 seconds.  If soap and water are not readily available, use an alcohol-based hand sanitizer with at least 60% alcohol.  . If coughing or sneezing, cover your mouth and nose by coughing or sneezing into the elbow areas of your shirt or coat, into a tissue or into your sleeve (not your hands). . Avoid shaking hands with others and consider head nods or verbal greetings only. . Avoid touching your eyes, nose, or mouth with unwashed hands.  . Avoid close contact with people who are sick. . Avoid places or events with large numbers of people in one location, like concerts or sporting events. . Carefully consider travel plans you have or  are making. . If you are planning any travel outside or inside the [place], visit the CDC's Travelers' Health webpage for the latest health notices. . If you have some symptoms but not all symptoms, continue to monitor at home and seek medical attention if your symptoms worsen. . If you are having a medical emergency, call 911.   [place] / e-Visit: [email or web address]         [facility]: [facility]: [phone]                   [facility]: [phone]     Safe Medications in Pregnancy   Acne: Benzoyl Peroxide Salicylic Acid  Backache/Headache: Tylenol: 2 regular strength every 4 hours OR              2 Extra strength every 6 hours  Colds/Coughs/Allergies: Benadryl (alcohol free) 25 mg every 6 hours as needed Breath right strips Claritin Cepacol throat lozenges Chloraseptic throat spray Cold-Eeze- up to three times per day Cough drops, alcohol free Flonase (by prescription only) Guaifenesin Mucinex Robitussin DM (plain only, alcohol free) Saline  nasal spray/drops Sudafed (pseudoephedrine) & Actifed ** use only after [redacted] weeks gestation and if you do not have high blood pressure Tylenol Vicks Vaporub Zinc lozenges Zyrtec   Constipation: Colace Ducolax suppositories Fleet enema Glycerin suppositories Metamucil Milk of magnesia Miralax Senokot Smooth move tea  Diarrhea: Kaopectate Imodium A-D  *NO pepto Bismol  Hemorrhoids: Anusol Anusol HC Preparation H Tucks  Indigestion: Tums Maalox Mylanta Zantac  Pepcid  Insomnia: Benadryl (alcohol free) 25mg  every 6 hours as needed Tylenol PM Unisom, no Gelcaps  Leg Cramps: Tums MagGel  Nausea/Vomiting:  Bonine Dramamine Emetrol Ginger extract Sea bands Meclizine  Nausea medication to take during pregnancy:  Unisom (doxylamine succinate  25 mg tablets) Take one tablet daily at bedtime. If symptoms are not adequately controlled, the dose can be increased to a maximum recommended dose of two tablets daily (1/2 tablet in the morning, 1/2 tablet mid-afternoon and one at bedtime). Vitamin B6 100mg  tablets. Take one tablet twice a day (up to 200 mg per day).  Skin Rashes: Aveeno products Benadryl cream or 25mg  every 6 hours as needed Calamine Lotion 1% cortisone cream  Yeast infection: Gyne-lotrimin 7 Monistat 7   **If taking multiple medications, please check labels to avoid duplicating the same active ingredients **take medication as directed on the label ** Do not exceed 4000 mg of tylenol in 24 hours **Do not take medications that contain aspirin or ibuprofen   Meet the Provider [place] for [place] is now offering FREE monthly 1-hour virtual Zoom sessions for new, current, and prospective patients.        During these sessions, you can:   Learn about our practice, model of care, services   Get answers to questions about pregnancy and birth during [place] your provider's brain about anything else!    Sessions will be hosted by [employer] for [employer], Engineer, materials, Physicians and Midwives          No registration required      2021 Dates:      All at 6pm     October 21st     November 18th   December 16th     January 20th  February 17th    To join one of these meetings, a few minutes before it is set to start:     Copy/paste the link into your web browser:  [email or web address]    OR  Scan the QR code below (open up your camera and point towards QR code; click on tab that pops up on your phone ("zoom")

## 2020-04-13 LAB — CBC/D/PLT+RPR+RH+ABO+RUB AB...
Antibody Screen: NEGATIVE
Basophils Absolute: 0 10*3/uL (ref 0.0–0.2)
Basos: 0 %
EOS (ABSOLUTE): 0.1 10*3/uL (ref 0.0–0.4)
Eos: 1 %
HCV Ab: <0.1 s/co ratio (ref 0.0–0.9)
HIV Screen 4th Generation wRfx: NONREACTIVE
Hematocrit: 36.4 % (ref 34.0–46.6)
Hemoglobin: 12.4 g/dL (ref 11.1–15.9)
Hepatitis B Surface Ag: NEGATIVE
Immature Grans (Abs): 0 10*3/uL (ref 0.0–0.1)
Immature Granulocytes: 0 %
Lymphocytes Absolute: 1.3 10*3/uL (ref 0.7–3.1)
Lymphs: 12 %
MCH: 28.6 pg (ref 26.6–33.0)
MCHC: 34.1 g/dL (ref 31.5–35.7)
MCV: 84 fL (ref 79–97)
Monocytes Absolute: 0.5 10*3/uL (ref 0.1–0.9)
Monocytes: 5 %
Neutrophils Absolute: 8.7 10*3/uL — ABNORMAL HIGH (ref 1.4–7.0)
Neutrophils: 82 %
Platelets: 207 10*3/uL (ref 150–450)
RBC: 4.33 x10E6/uL (ref 3.77–5.28)
RDW: 12.9 % (ref 11.7–15.4)
RPR Ser Ql: NONREACTIVE
Rh Factor: NEGATIVE
Rubella Antibodies, IGG: 3.01 index (ref >0.99)
WBC: 10.5 10*3/uL (ref 3.4–10.8)

## 2020-04-13 LAB — HCV INTERPRETATION

## 2020-04-13 LAB — MED LIST OPTION NOT SELECTED

## 2020-04-14 LAB — URINE CULTURE

## 2020-04-14 NOTE — L&D Delivery Note (Signed)
Delivery Note Called to bedside and patient complete and pushing. At 2:39 AM a viable female was delivered via Vaginal, Spontaneous (Presentation: Left Occiput Anterior).  No nuchal cord noted at delivery. Bilateral compound hands noted. APGAR:9, ;9, weight 3286g.   Placenta status: Spontaneous, Intact.  Cord: 3 vessels with the following complications: None.   Anesthesia: Epidural Episiotomy: None Lacerations: 1st degree Suture Repair: 3.0 vicryl Est. Blood Loss (mL): 100  Mom to postpartum.  Baby to Couplet care / Skin to Skin.  Alric Seton 10/04/2020, 2:54 AM

## 2020-04-16 LAB — CYTOLOGY - PAP
Adequacy: ABSENT
Chlamydia: NEGATIVE
Comment: NEGATIVE
Comment: NEGATIVE
Comment: NORMAL
Diagnosis: NEGATIVE
High risk HPV: NEGATIVE
Neisseria Gonorrhea: NEGATIVE

## 2020-04-16 LAB — PMP SCREEN PROFILE (10S), URINE
Amphetamine Scrn, Ur: NEGATIVE ng/mL
BARBITURATE SCREEN URINE: NEGATIVE ng/mL
BENZODIAZEPINE SCREEN, URINE: NEGATIVE ng/mL
CANNABINOIDS UR QL SCN: NEGATIVE ng/mL
Cocaine (Metab) Scrn, Ur: NEGATIVE ng/mL
Creatinine(Crt), U: 44.3 mg/dL (ref 20.0–300.0)
Methadone Screen, Urine: NEGATIVE ng/mL
OXYCODONE+OXYMORPHONE UR QL SCN: NEGATIVE ng/mL
Opiate Scrn, Ur: NEGATIVE ng/mL
Ph of Urine: 7.6 (ref 4.5–8.9)
Phencyclidine Qn, Ur: NEGATIVE ng/mL
Propoxyphene Scrn, Ur: NEGATIVE ng/mL

## 2020-05-10 ENCOUNTER — Encounter: Payer: Self-pay | Admitting: Advanced Practice Midwife

## 2020-05-10 ENCOUNTER — Other Ambulatory Visit: Payer: Self-pay

## 2020-05-10 ENCOUNTER — Ambulatory Visit (INDEPENDENT_AMBULATORY_CARE_PROVIDER_SITE_OTHER): Payer: Medicaid - Out of State | Admitting: Advanced Practice Midwife

## 2020-05-10 ENCOUNTER — Ambulatory Visit (INDEPENDENT_AMBULATORY_CARE_PROVIDER_SITE_OTHER): Payer: Medicaid - Out of State

## 2020-05-10 VITALS — BP 128/75 | HR 80 | Wt 214.5 lb

## 2020-05-10 DIAGNOSIS — Z3480 Encounter for supervision of other normal pregnancy, unspecified trimester: Secondary | ICD-10-CM

## 2020-05-10 DIAGNOSIS — Z363 Encounter for antenatal screening for malformations: Secondary | ICD-10-CM | POA: Diagnosis not present

## 2020-05-10 DIAGNOSIS — Z6791 Unspecified blood type, Rh negative: Secondary | ICD-10-CM

## 2020-05-10 DIAGNOSIS — Z3A18 18 weeks gestation of pregnancy: Secondary | ICD-10-CM

## 2020-05-10 DIAGNOSIS — Z1379 Encounter for other screening for genetic and chromosomal anomalies: Secondary | ICD-10-CM

## 2020-05-10 DIAGNOSIS — O26899 Other specified pregnancy related conditions, unspecified trimester: Secondary | ICD-10-CM

## 2020-05-10 NOTE — Progress Notes (Signed)
   LOW-RISK PREGNANCY VISIT Patient name: Jodi King MRN [id number]  Date of birth: 09-02-1990 Chief Complaint:   Routine Prenatal Visit ([place] today; 2nd IT)  History of Present Illness:   Jodi King is a 30 y.o. G64P1011 female at [redacted]w[redacted]d with an Estimated Date of Delivery: 10/10/20 being seen today for ongoing management of a low-risk pregnancy.  Today she reports feeling pretty good.  Nausea is hit or miss. Contractions: Not present. Vag. Bleeding: None.  Movement: Present. denies leaking of fluid. Review of Systems:   Pertinent items are noted in HPI Denies abnormal vaginal discharge w/ itching/odor/irritation, headaches, visual changes, shortness of breath, chest pain, abdominal pain, severe nausea/vomiting, or problems with urination or bowel movements unless otherwise stated above. Pertinent History Reviewed:  Reviewed past medical,surgical, social, obstetrical and family history.  Reviewed problem list, medications and allergies. Physical Assessment:   Vitals:   05/10/20 0948  BP: 128/75  Pulse: 80  Weight: 214 lb 8 oz (97.3 kg)  Body mass index is 40.53 kg/m.        Physical Examination:   General appearance: Well appearing, and in no distress  Mental status: Alert, oriented to person, place, and time  Skin: Warm & dry  Cardiovascular: Normal heart rate noted  Respiratory: Normal respiratory effort, no distress  Abdomen: Soft, gravid, nontender  Pelvic: Cervical exam deferred         Extremities: Edema: None  Fetal Status:     Movement: Present   [place] 18+1 wks,breech,posterior placenta gr 0,normal ovaries,svp of fluid 5 cm,fhr 142 bpm,cx 3.8 cm,EFW 223 g 41%,anatomy complete,no obvious abnormalities   Chaperone: n/a    No results found for this or any previous visit (from the past 24 hour(s)).  Assessment & Plan:  1) Low-risk pregnancy G3P1011 at [redacted]w[redacted]d with an Estimated Date of Delivery: 10/10/20      Meds: No orders of the defined types were placed in this  encounter.  Labs/procedures today: 2nd IT  Plan:  Continue routine obstetrical care  Next visit: prefers online    Reviewed: Preterm labor symptoms and general obstetric precautions including but not limited to vaginal bleeding, contractions, leaking of fluid and fetal movement were reviewed in detail with the patient.  All questions were answered. Has home bp cuff.. Check bp weekly, let us know if >140/90.   Follow-up: Return in about 4 weeks (around 06/07/2020) for [facility] Mychart visit.  Orders Placed This Encounter  Procedures  . INTEGRATED 2   Jacklyn Shell DNP, CNM 05/10/2020 10:25 AM

## 2020-05-10 NOTE — Progress Notes (Signed)
[place] 18+1 wks,breech,posterior placenta gr 0,normal ovaries,svp of fluid 5 cm,fhr 142 bpm,cx 3.8 cm,EFW 223 g 41%,anatomy complete,no obvious abnormalities

## 2020-05-10 NOTE — Patient Instructions (Signed)
Jodi King, I greatly value your feedback.  If you receive a survey following your visit with [place] today, we appreciate you taking the time to fill it out.  Thanks, Cathie Beams, CNM     [facility] HAS MOVED!!! It is now [facility] at [facility] ([address] [place], [place] [address]) Entrance located off of [address] Free 24/7 valet parking   Go to [email or web address] to register for FREE online childbirth classes    Second Trimester of Pregnancy The second trimester is from week 14 through week 27 (months 4 through 6). The second trimester is often a time when you feel your best. Your body has adjusted to being pregnant, and you begin to feel better physically. Usually, morning sickness has lessened or quit completely, you may have more energy, and you may have an increase in appetite. The second trimester is also a time when the fetus is growing rapidly. At the end of the sixth month, the fetus is about 9 inches long and weighs about 1 pounds. You will likely begin to feel the baby move (quickening) between 16 and 20 weeks of pregnancy. Body changes during your second trimester Your body continues to go through many changes during your second trimester. The changes vary from woman to woman.  Your weight will continue to increase. You will notice your lower abdomen bulging out.  You may begin to get stretch marks on your hips, abdomen, and breasts.  You may develop headaches that can be relieved by medicines. The medicines should be approved by your health care provider.  You may urinate more often because the fetus is pressing on your bladder.  You may develop or continue to have heartburn as a result of your pregnancy.  You may develop constipation because certain hormones are causing the muscles that push waste through your intestines to slow down.  You may develop hemorrhoids or swollen, bulging veins (varicose veins).  You may have  back pain. This is caused by: ? Weight gain. ? Pregnancy hormones that are relaxing the joints in your pelvis. ? A shift in weight and the muscles that support your balance.  Your breasts will continue to grow and they will continue to become tender.  Your gums may bleed and may be sensitive to brushing and flossing.  Dark spots or blotches (chloasma, mask of pregnancy) may develop on your face. This will likely fade after the baby is born.  A dark line from your belly button to the pubic area (linea nigra) may appear. This will likely fade after the baby is born.  You may have changes in your hair. These can include thickening of your hair, rapid growth, and changes in texture. Some women also have hair loss during or after pregnancy, or hair that feels dry or thin. Your hair will most likely return to normal after your baby is born.  What to expect at prenatal visits During a routine prenatal visit:  You will be weighed to make sure you and the fetus are growing normally.  Your blood pressure will be taken.  Your abdomen will be measured to track your baby's growth.  The fetal heartbeat will be listened to.  Any test results from the previous visit will be discussed.  Your health care provider may ask you:  How you are feeling.  If you are feeling the baby move.  If you have had any abnormal symptoms, such as leaking fluid, bleeding, severe headaches, or abdominal  cramping.  If you are using any tobacco products, including cigarettes, chewing tobacco, and electronic cigarettes.  If you have any questions.  Other tests that may be performed during your second trimester include:  Blood tests that check for: ? Low iron levels (anemia). ? High blood sugar that affects pregnant women (gestational diabetes) between 65 and 28 weeks. ? Rh antibodies. This is to check for a protein on red blood cells (Rh factor).  Urine tests to check for infections, diabetes, or protein in  the urine.  An ultrasound to confirm the proper growth and development of the baby.  An amniocentesis to check for possible genetic problems.  Fetal screens for spina bifida and Down syndrome.  HIV (human immunodeficiency virus) testing. Routine prenatal testing includes screening for HIV, unless you choose not to have this test.  Follow these instructions at home: Medicines  Follow your health care provider's instructions regarding medicine use. Specific medicines may be either safe or unsafe to take during pregnancy.  Take a prenatal vitamin that contains at least 600 micrograms (mcg) of folic acid.  If you develop constipation, try taking a stool softener if your health care provider approves. Eating and drinking  Eat a balanced diet that includes fresh fruits and vegetables, whole grains, good sources of protein such as meat, eggs, or tofu, and low-fat dairy. Your health care provider will help you determine the amount of weight gain that is right for you.  Avoid raw meat and uncooked cheese. These carry germs that can cause birth defects in the baby.  If you have low calcium intake from food, talk to your health care provider about whether you should take a daily calcium supplement.  Limit foods that are high in fat and processed sugars, such as fried and sweet foods.  To prevent constipation: ? Drink enough fluid to keep your urine clear or pale yellow. ? Eat foods that are high in fiber, such as fresh fruits and vegetables, whole grains, and beans. Activity  Exercise only as directed by your health care provider. Most women can continue their usual exercise routine during pregnancy. Try to exercise for 30 minutes at least 5 days a week. Stop exercising if you experience uterine contractions.  Avoid heavy lifting, wear low heel shoes, and practice good posture.  A sexual relationship may be continued unless your health care provider directs you otherwise. Relieving pain  and discomfort  Wear a good support bra to prevent discomfort from breast tenderness.  Take warm sitz baths to soothe any pain or discomfort caused by hemorrhoids. Use hemorrhoid cream if your health care provider approves.  Rest with your legs elevated if you have leg cramps or low back pain.  If you develop varicose veins, wear support hose. Elevate your feet for 15 minutes, 3-4 times a day. Limit salt in your diet. Prenatal Care  Write down your questions. Take them to your prenatal visits.  Keep all your prenatal visits as told by your health care provider. This is important. Safety  Wear your seat belt at all times when driving.  Make a list of emergency phone numbers, including numbers for family, friends, the hospital, and police and fire departments. General instructions  Ask your health care provider for a referral to a local prenatal education class. Begin classes no later than the beginning of month 6 of your pregnancy.  Ask for help if you have counseling or nutritional needs during pregnancy. Your health care provider can offer advice or refer  you to specialists for help with various needs.  Do not use hot tubs, steam rooms, or saunas.  Do not douche or use tampons or scented sanitary pads.  Do not cross your legs for long periods of time.  Avoid cat litter boxes and soil used by cats. These carry germs that can cause birth defects in the baby and possibly loss of the fetus by miscarriage or stillbirth.  Avoid all smoking, herbs, alcohol, and unprescribed drugs. Chemicals in these products can affect the formation and growth of the baby.  Do not use any products that contain nicotine or tobacco, such as cigarettes and e-cigarettes. If you need help quitting, ask your health care provider.  Visit your dentist if you have not gone yet during your pregnancy. Use a soft toothbrush to brush your teeth and be gentle when you floss. Contact a health care provider  if:  You have dizziness.  You have mild pelvic cramps, pelvic pressure, or nagging pain in the abdominal area.  You have persistent nausea, vomiting, or diarrhea.  You have a bad smelling vaginal discharge.  You have pain when you urinate. Get help right away if:  You have a fever.  You are leaking fluid from your vagina.  You have spotting or bleeding from your vagina.  You have severe abdominal cramping or pain.  You have rapid weight gain or weight loss.  You have shortness of breath with chest pain.  You notice sudden or extreme swelling of your face, hands, ankles, feet, or legs.  You have not felt your baby move in over an hour.  You have severe headaches that do not go away when you take medicine.  You have vision changes. Summary  The second trimester is from week 14 through week 27 (months 4 through 6). It is also a time when the fetus is growing rapidly.  Your body goes through many changes during pregnancy. The changes vary from woman to woman.  Avoid all smoking, herbs, alcohol, and unprescribed drugs. These chemicals affect the formation and growth your baby.  Do not use any tobacco products, such as cigarettes, chewing tobacco, and e-cigarettes. If you need help quitting, ask your health care provider.  Contact your health care provider if you have any questions. Keep all prenatal visits as told by your health care provider. This is important. This information is not intended to replace advice given to you by your health care provider. Make sure you discuss any questions you have with your health care provider.

## 2020-05-12 LAB — INTEGRATED 2
AFP MoM: 1.19
Alpha-Fetoprotein: 37.2 ng/mL
Crown Rump Length: 53.8 mm
DIA MoM: 1.98
DIA Value: 254.4 pg/mL
Estriol, Unconjugated: 1.35 ng/mL
Gest. Age on Collection Date: 11.9 weeks
Gestational Age: 18 weeks
Maternal Age at EDD: 29.2 yr
Nuchal Translucency (NT): 1.2 mm
Nuchal Translucency MoM: 1
Number of Fetuses: 1
PAPP-A MoM: 0.78
PAPP-A Value: 419.1 ng/mL
Test Results:: NEGATIVE
Weight: 203 [lb_av]
Weight: 215 [lb_av]
hCG MoM: 0.82
hCG Value: 16.6 IU/mL
uE3 MoM: 1.04

## 2020-06-07 ENCOUNTER — Encounter: Payer: Medicaid - Out of State | Admitting: Advanced Practice Midwife

## 2020-06-14 ENCOUNTER — Encounter: Payer: Medicaid - Out of State | Admitting: Women's Health

## 2020-06-15 ENCOUNTER — Encounter: Payer: Self-pay | Admitting: Women's Health

## 2020-06-15 ENCOUNTER — Other Ambulatory Visit: Payer: Self-pay

## 2020-06-15 ENCOUNTER — Ambulatory Visit (INDEPENDENT_AMBULATORY_CARE_PROVIDER_SITE_OTHER): Payer: Medicaid - Out of State | Admitting: Women's Health

## 2020-06-15 VITALS — BP 129/80 | HR 113 | Wt 221.4 lb

## 2020-06-15 DIAGNOSIS — Z3482 Encounter for supervision of other normal pregnancy, second trimester: Secondary | ICD-10-CM

## 2020-06-15 DIAGNOSIS — D649 Anemia, unspecified: Secondary | ICD-10-CM

## 2020-06-15 DIAGNOSIS — Z3480 Encounter for supervision of other normal pregnancy, unspecified trimester: Secondary | ICD-10-CM

## 2020-06-15 LAB — POCT HEMOGLOBIN: Hemoglobin: 9.5 g/dL — AB (ref 11–14.6)

## 2020-06-15 MED ORDER — FERROUS SULFATE 325 (65 FE) MG PO TABS: 325.0000 mg | ORAL_TABLET | ORAL | 2 refills | Status: AC

## 2020-06-15 NOTE — Progress Notes (Signed)
LOW-RISK PREGNANCY VISIT Patient name: Jodi King MRN [id number]  Date of birth: 11/18/1990 Chief Complaint:   Routine Prenatal Visit  History of Present Illness:   Jodi King is a 30 y.o. G63P1011 female at [redacted]w[redacted]d with an Estimated Date of Delivery: 10/10/20 being seen today for ongoing management of a low-risk pregnancy.  Depression screen [facility] 2/9 04/12/2020  Decreased Interest 0  Down, Depressed, Hopeless 0  PHQ - 2 Score 0  Altered sleeping 0  Tired, decreased energy 1  Change in appetite 0  Feeling bad or failure about yourself  0  Trouble concentrating 0  Moving slowly or fidgety/restless 0  Suicidal thoughts 0  PHQ-9 Score 1    Today she reports feels fatigued. Contractions: Not present. Vag. Bleeding: None.  Movement: Present. denies leaking of fluid. Review of Systems:   Pertinent items are noted in HPI Denies abnormal vaginal discharge w/ itching/odor/irritation, headaches, visual changes, shortness of breath, chest pain, abdominal pain, severe nausea/vomiting, or problems with urination or bowel movements unless otherwise stated above. Pertinent History Reviewed:  Reviewed past medical,surgical, social, obstetrical and family history.  Reviewed problem list, medications and allergies. Physical Assessment:   Vitals:   06/15/20 0910 06/15/20 0913  BP: (!) 153/81 129/80  Pulse: (!) 111 (!) 113  Weight: 221 lb 6.4 oz (100.4 kg)   Body mass index is 41.83 kg/m.        Physical Examination:   General appearance: Well appearing, and in no distress  Mental status: Alert, oriented to person, place, and time  Skin: Warm & dry  Cardiovascular: Normal heart rate noted  Respiratory: Normal respiratory effort, no distress  Abdomen: Soft, gravid, nontender  Pelvic: Cervical exam deferred         Extremities: Edema: Trace  Fetal Status: Fetal Heart Rate (bpm): 138 Fundal Height: 24 cm Movement: Present    Chaperone: N/A   Results for orders placed or performed  in visit on 06/15/20 (from the past 24 hour(s))  POCT hemoglobin   Collection Time: 06/15/20 10:11 AM  Result Value Ref Range   Hemoglobin 9.5 (A) 11 - 14.6 g/dL    Assessment & Plan:  1) Low-risk pregnancy G3P1011 at [redacted]w[redacted]d with an Estimated Date of Delivery: 10/10/20   2) Initially elevated bp, repeat normal, no h/o HTN, reviewed pre-e s/s, reasons to seek care  3) Fatigue> fingerstick hgb 9.5, Fe QOD   Meds:  Meds ordered this encounter  Medications  . ferrous sulfate 325 (65 FE) MG tablet    Sig: Take 1 tablet (325 mg total) by mouth every other day.    Dispense:  45 tablet    Refill:  2    Order Specific Question:   Supervising Provider    Answer:   Lazaro Arms [[id number]]   Labs/procedures today: fingerstick hgb  Plan:  Continue routine obstetrical care  Next visit: prefers will be in person for pn2    Reviewed: Preterm labor symptoms and general obstetric precautions including but not limited to vaginal bleeding, contractions, leaking of fluid and fetal movement were reviewed in detail with the patient.  All questions were answered. Has home bp cuff. Check bp weekly, let us know if >140/90.   Follow-up: Return in about 4 weeks (around 07/13/2020) for LROB, PN2, in person, CNM.  Future Appointments  Date Time Provider Department Center  07/13/2020  8:50 AM [facility]  07/13/2020  9:10 AM Cheral Marker, CNM [facility]    Orders Placed  This Encounter  Procedures  . POCT hemoglobin   Cheral Marker CNM, [facility] 06/15/2020 10:34 AM

## 2020-06-15 NOTE — Patient Instructions (Signed)
Jodi King, I greatly value your feedback.  If you receive a survey following your visit with [place] today, we appreciate you taking the time to fill it out.  Thanks, Jodi King, CNM, WHNP-BC   You will have your sugar test next visit.  Please do not eat or drink anything after midnight the night before you come, not even water.  You will be here for at least two hours.  Please make an appointment online for the bloodwork at [email or web address] for 8:30am (or as close to this as possible). Make sure you select the [facility] service center. The day of the appointment, check in with our office first, then you will go to [facility] to start the sugar test.    [facility] at [facility] [place], [place] [address]) Entrance C, located off of [address] valet parking  Go to [email or web address] to register for FREE online childbirth classes   Call the office [phone]) or go to [facility] if:  You begin to have strong, frequent contractions  Your water breaks.  Sometimes it is a big gush of fluid, sometimes it is just a trickle that keeps getting your panties wet or running down your legs  You have vaginal bleeding.  It is normal to have a small amount of spotting if your cervix was checked.   You don't feel your baby moving like normal.  If you don't, get you something to eat and drink and lay down and focus on feeling your baby move.   If your baby is still not moving like normal, you should call the office or go to [facility].  [place] Pediatricians/Family Doctors:  [facility] [phone]            [facility] [phone]                 [facility] [phone] (usually not accepting new patients unless you have family there already, you are always welcome to call and ask)       [facility] Department [phone]       [facility] Pediatricians/Family Doctors:   [facility]:  [phone]  [facility]: [phone]  [facility]: [phone]  [facility] Doctors:   [facility]: [phone]   [facility]: [phone]  [facility] Doctors:  [facility]: [phone]   Home Blood Pressure Monitoring for Patients   Your provider has recommended that you check your blood pressure (BP) at least once a week at home. If you do not have a blood pressure cuff at home, one will be provided for you. Contact your provider if you have not received your monitor within 1 week.   Helpful Tips for Accurate Home Blood Pressure Checks  . Don't smoke, exercise, or drink caffeine 30 minutes before checking your BP . Use the restroom before checking your BP (a full bladder can raise your pressure) . Relax in a comfortable upright chair . Feet on the ground . Left arm resting comfortably on a flat surface at the level of your heart . Legs uncrossed . Back supported . Sit quietly and don't talk . Place the cuff on your bare arm . Adjust snuggly, so that only two fingertips can fit between your skin and the top of the cuff . Check 2 readings separated by at least one minute . Keep a log of your BP readings . For a visual, please reference  this diagram: http://ccnc.care/bpdiagram  Provider Name: [facility]     Phone: [phone]  Zone 1: ALL CLEAR  Continue to monitor your symptoms:  . BP reading is less than 140 (top number) or less than 90 (bottom number)  . No right upper stomach pain . No headaches or seeing spots . No feeling nauseated or throwing up . No swelling in face and hands  Zone 2: CAUTION Call your doctor's office for any of the following:  . BP reading is greater than 140 (top number) or greater than 90 (bottom number)  . Stomach pain under your ribs in the middle or right side . Headaches or seeing spots . Feeling nauseated or throwing up . Swelling in  face and hands  Zone 3: EMERGENCY  Seek immediate medical care if you have any of the following:  . BP reading is greater than160 (top number) or greater than 110 (bottom number) . Severe headaches not improving with Tylenol . Serious difficulty catching your breath . Any worsening symptoms from Zone 2   Second Trimester of Pregnancy The second trimester is from week 13 through week 28, months 4 through 6. The second trimester is often a time when you feel your best. Your body has also adjusted to being pregnant, and you begin to feel better physically. Usually, morning sickness has lessened or quit completely, you may have more energy, and you may have an increase in appetite. The second trimester is also a time when the fetus is growing rapidly. At the end of the sixth month, the fetus is about 9 inches long and weighs about 1 pounds. You will likely begin to feel the baby move (quickening) between 18 and 20 weeks of the pregnancy. BODY CHANGES Your body goes through many changes during pregnancy. The changes vary from woman to woman.   Your weight will continue to increase. You will notice your lower abdomen bulging out.  You may begin to get stretch marks on your hips, abdomen, and breasts.  You may develop headaches that can be relieved by medicines approved by your health care provider.  You may urinate more often because the fetus is pressing on your bladder.  You may develop or continue to have heartburn as a result of your pregnancy.  You may develop constipation because certain hormones are causing the muscles that push waste through your intestines to slow down.  You may develop hemorrhoids or swollen, bulging veins (varicose veins).  You may have back pain because of the weight gain and pregnancy hormones relaxing your joints between the bones in your pelvis and as a result of a shift in weight and the muscles that support your balance.  Your breasts will continue to grow  and be tender.  Your gums may bleed and may be sensitive to brushing and flossing.  Dark spots or blotches (chloasma, mask of pregnancy) may develop on your face. This will likely fade after the baby is born.  A dark line from your belly button to the pubic area (linea nigra) may appear. This will likely fade after the baby is born.  You may have changes in your hair. These can include thickening of your hair, rapid growth, and changes in texture. Some women also have hair loss during or after pregnancy, or hair that feels dry or thin. Your hair will most likely return to normal after your baby is born. WHAT TO EXPECT AT YOUR PRENATAL VISITS During a routine prenatal visit:  You will  be weighed to make sure you and the fetus are growing normally.  Your blood pressure will be taken.  Your abdomen will be measured to track your baby's growth.  The fetal heartbeat will be listened to.  Any test results from the previous visit will be discussed. Your health care provider may ask you:  How you are feeling.  If you are feeling the baby move.  If you have had any abnormal symptoms, such as leaking fluid, bleeding, severe headaches, or abdominal cramping.  If you have any questions. Other tests that may be performed during your second trimester include:  Blood tests that check for:  Low iron levels (anemia).  Gestational diabetes (between 24 and 28 weeks).  Rh antibodies.  Urine tests to check for infections, diabetes, or protein in the urine.  An ultrasound to confirm the proper growth and development of the baby.  An amniocentesis to check for possible genetic problems.  Fetal screens for spina bifida and Down syndrome. HOME CARE INSTRUCTIONS   Avoid all smoking, herbs, alcohol, and unprescribed drugs. These chemicals affect the formation and growth of the baby.  Follow your health care provider's instructions regarding medicine use. There are medicines that are either  safe or unsafe to take during pregnancy.  Exercise only as directed by your health care provider. Experiencing uterine cramps is a good sign to stop exercising.  Continue to eat regular, healthy meals.  Wear a good support bra for breast tenderness.  Do not use hot tubs, steam rooms, or saunas.  Wear your seat belt at all times when driving.  Avoid raw meat, uncooked cheese, cat litter boxes, and soil used by cats. These carry germs that can cause birth defects in the baby.  Take your prenatal vitamins.  Try taking a stool softener (if your health care provider approves) if you develop constipation. Eat more high-fiber foods, such as fresh vegetables or fruit and whole grains. Drink plenty of fluids to keep your urine clear or pale yellow.  Take warm sitz baths to soothe any pain or discomfort caused by hemorrhoids. Use hemorrhoid cream if your health care provider approves.  If you develop varicose veins, wear support hose. Elevate your feet for 15 minutes, 3-4 times a day. Limit salt in your diet.  Avoid heavy lifting, wear low heel shoes, and practice good posture.  Rest with your legs elevated if you have leg cramps or low back pain.  Visit your dentist if you have not gone yet during your pregnancy. Use a soft toothbrush to brush your teeth and be gentle when you floss.  A sexual relationship may be continued unless your health care provider directs you otherwise.  Continue to go to all your prenatal visits as directed by your health care provider. SEEK MEDICAL CARE IF:   You have dizziness.  You have mild pelvic cramps, pelvic pressure, or nagging pain in the abdominal area.  You have persistent nausea, vomiting, or diarrhea.  You have a bad smelling vaginal discharge.  You have pain with urination. SEEK IMMEDIATE MEDICAL CARE IF:   You have a fever.  You are leaking fluid from your vagina.  You have spotting or bleeding from your vagina.  You have severe  abdominal cramping or pain.  You have rapid weight gain or loss.  You have shortness of breath with chest pain.  You notice sudden or extreme swelling of your face, hands, ankles, feet, or legs.  You have not felt your baby move in  over an hour.  You have severe headaches that do not go away with medicine.  You have vision changes. Document Released: 03/25/2001 Document Revised: 04/05/2013 Document Reviewed: 06/01/2012 [facility] Patient Information 2015 [place], [place]. This information is not intended to replace advice given to you by your health care provider. Make sure you discuss any questions you have with your health care provider.

## 2020-07-13 ENCOUNTER — Encounter: Payer: Self-pay | Admitting: Women's Health

## 2020-07-13 ENCOUNTER — Other Ambulatory Visit: Payer: Medicaid - Out of State

## 2020-07-13 ENCOUNTER — Other Ambulatory Visit: Payer: Self-pay

## 2020-07-13 ENCOUNTER — Ambulatory Visit (INDEPENDENT_AMBULATORY_CARE_PROVIDER_SITE_OTHER): Payer: Medicaid - Out of State | Admitting: Women's Health

## 2020-07-13 VITALS — BP 132/71 | HR 88 | Wt 228.8 lb

## 2020-07-13 DIAGNOSIS — Z3483 Encounter for supervision of other normal pregnancy, third trimester: Secondary | ICD-10-CM

## 2020-07-13 DIAGNOSIS — O99013 Anemia complicating pregnancy, third trimester: Secondary | ICD-10-CM

## 2020-07-13 DIAGNOSIS — Z23 Encounter for immunization: Secondary | ICD-10-CM

## 2020-07-13 DIAGNOSIS — Z3480 Encounter for supervision of other normal pregnancy, unspecified trimester: Secondary | ICD-10-CM

## 2020-07-13 DIAGNOSIS — Z3A27 27 weeks gestation of pregnancy: Secondary | ICD-10-CM

## 2020-07-13 DIAGNOSIS — Z131 Encounter for screening for diabetes mellitus: Secondary | ICD-10-CM

## 2020-07-13 LAB — POCT HEMOGLOBIN: Hemoglobin: 9.1 g/dL — AB (ref 11–14.6)

## 2020-07-13 NOTE — Patient Instructions (Addendum)
Jodi King, I greatly value your feedback.  If you receive a survey following your visit with [place] today, we appreciate you taking the time to fill it out.  Thanks, Joellyn Haff, CNM, WHNP-BC   [facility] ([address] [place], [place] [address]) Entrance C, located off of [address] [employer] [address] valet parking   Constipation  Drink plenty of fluid, preferably water, throughout the day  Eat foods high in fiber such as fruits, vegetables, and grains  Exercise, such as walking, is a good way to keep your bowels regular  Drink warm fluids, especially warm prune juice, or decaf coffee  Eat a 1/2 cup of real oatmeal (not instant), 1/2 cup applesauce, and 1/2-1 cup warm prune juice every day  If needed, you may take Colace (docusate sodium) stool softener once or twice a day to help keep the stool soft. If you are pregnant, wait until you are out of your first trimester (12-14 weeks of pregnancy)  If you still are having problems with constipation, you may take Miralax once daily as needed to help keep your bowels regular.  If you are pregnant, wait until you are out of your first trimester (12-14 weeks of pregnancy)    Go to [email or web address] to register for FREE online childbirth classes   Call the office [phone]) or go to [facility] if:  You begin to have strong, frequent contractions  Your water breaks.  Sometimes it is a big gush of fluid, sometimes it is just a trickle that keeps getting your panties wet or running down your legs  You have vaginal bleeding.  It is normal to have a small amount of spotting if your cervix was checked.   You don't feel your baby moving like normal.  If you don't, get you something to eat and drink and lay down and focus on feeling your baby move.  You should feel at least 10 movements in 2 hours.  If you don't, you should call the office or go to [facility].    Tdap Vaccine  It is recommended that you  get the Tdap vaccine during the third trimester of EACH pregnancy to help protect your baby from getting pertussis (whooping cough)  27-36 weeks is the BEST time to do this so that you can pass the protection on to your baby. During pregnancy is better than after pregnancy, but if you are unable to get it during pregnancy it will be offered at the hospital.   You can get this vaccine with [place], at the health department, your family doctor, or some local pharmacies  Everyone who will be around your baby should also be up-to-date on their vaccines before the baby comes. Adults (who are not pregnant) only need 1 dose of Tdap during adulthood.   [place] Pediatricians/Family Doctors:  [facility] [phone]            [facility] [phone]                 [facility] [phone] (usually not accepting new patients unless you have family there already, you are always welcome to call and ask)       [facility] [phone]       [facility] Pediatricians/Family Doctors:   [facility]: [phone]  [facility]: [phone]  [facility]: [phone]  [facility] Doctors:   [facility]: [phone]   [facility]:  [phone]  [facility] Family Doctors:  [facility]: [phone]   Home Blood Pressure Monitoring for Patients   Your provider has recommended that you check your blood pressure (BP) at least once a week at home. If you do not have a blood pressure cuff at home, one will be provided for you. Contact your provider if you have not received your monitor within 1 week.   Helpful Tips for Accurate Home Blood Pressure Checks  . Don't smoke, exercise, or drink caffeine 30 minutes before checking your BP . Use the restroom before checking your BP (a full bladder can raise your pressure) . Relax in a comfortable upright  chair . Feet on the ground . Left arm resting comfortably on a flat surface at the level of your heart . Legs uncrossed . Back supported . Sit quietly and don't talk . Place the cuff on your bare arm . Adjust snuggly, so that only two fingertips can fit between your skin and the top of the cuff . Check 2 readings separated by at least one minute . Keep a log of your BP readings . For a visual, please reference this diagram: http://ccnc.care/bpdiagram  Provider Name: [facility]     Phone: [phone]  Zone 1: ALL CLEAR  Continue to monitor your symptoms:  . BP reading is less than 140 (top number) or less than 90 (bottom number)  . No right upper stomach pain . No headaches or seeing spots . No feeling nauseated or throwing up . No swelling in face and hands  Zone 2: CAUTION Call your doctor's office for any of the following:  . BP reading is greater than 140 (top number) or greater than 90 (bottom number)  . Stomach pain under your ribs in the middle or right side . Headaches or seeing spots . Feeling nauseated or throwing up . Swelling in face and hands  Zone 3: EMERGENCY  Seek immediate medical care if you have any of the following:  . BP reading is greater than160 (top number) or greater than 110 (bottom number) . Severe headaches not improving with Tylenol . Serious difficulty catching your breath . Any worsening symptoms from Zone 2   Third Trimester of Pregnancy The third trimester is from week 29 through week 42, months 7 through 9. The third trimester is a time when the fetus is growing rapidly. At the end of the ninth month, the fetus is about 20 inches in length and weighs 6-10 pounds.  BODY CHANGES Your body goes through many changes during pregnancy. The changes vary from woman to woman.   Your weight will continue to increase. You can expect to gain 25-35 pounds (11-16 kg) by the end of the pregnancy.  You may begin to get stretch marks on your hips,  abdomen, and breasts.  You may urinate more often because the fetus is moving lower into your pelvis and pressing on your bladder.  You may develop or continue to have heartburn as a result of your pregnancy.  You may develop constipation because certain hormones are causing the muscles that push waste through your intestines to slow down.  You may develop hemorrhoids or swollen, bulging veins (varicose veins).  You may have pelvic pain because of the weight gain and pregnancy hormones relaxing your joints between the bones in your pelvis. Backaches may result from overexertion of the muscles supporting your posture.  You may have changes in your hair. These can include thickening of your hair,  rapid growth, and changes in texture. Some women also have hair loss during or after pregnancy, or hair that feels dry or thin. Your hair will most likely return to normal after your baby is born.  Your breasts will continue to grow and be tender. A yellow discharge may leak from your breasts called colostrum.  Your belly button may stick out.  You may feel short of breath because of your expanding uterus.  You may notice the fetus "dropping," or moving lower in your abdomen.  You may have a bloody mucus discharge. This usually occurs a few days to a week before labor begins.  Your cervix becomes thin and soft (effaced) near your due date. WHAT TO EXPECT AT YOUR PRENATAL EXAMS  You will have prenatal exams every 2 weeks until week 36. Then, you will have weekly prenatal exams. During a routine prenatal visit:  You will be weighed to make sure you and the fetus are growing normally.  Your blood pressure is taken.  Your abdomen will be measured to track your baby's growth.  The fetal heartbeat will be listened to.  Any test results from the previous visit will be discussed.  You may have a cervical check near your due date to see if you have effaced. At around 36 weeks, your caregiver will  check your cervix. At the same time, your caregiver will also perform a test on the secretions of the vaginal tissue. This test is to determine if a type of bacteria, Group B streptococcus, is present. Your caregiver will explain this further. Your caregiver may ask you:  What your birth plan is.  How you are feeling.  If you are feeling the baby move.  If you have had any abnormal symptoms, such as leaking fluid, bleeding, severe headaches, or abdominal cramping.  If you have any questions. Other tests or screenings that may be performed during your third trimester include:  Blood tests that check for low iron levels (anemia).  Fetal testing to check the health, activity level, and growth of the fetus. Testing is done if you have certain medical conditions or if there are problems during the pregnancy. FALSE LABOR You may feel small, irregular contractions that eventually go away. These are called Braxton Hicks contractions, or false labor. Contractions may last for hours, days, or even weeks before true labor sets in. If contractions come at regular intervals, intensify, or become painful, it is best to be seen by your caregiver.  SIGNS OF LABOR   Menstrual-like cramps.  Contractions that are 5 minutes apart or less.  Contractions that start on the top of the uterus and spread down to the lower abdomen and back.  A sense of increased pelvic pressure or back pain.  A watery or bloody mucus discharge that comes from the vagina. If you have any of these signs before the 37th week of pregnancy, call your caregiver right away. You need to go to the hospital to get checked immediately. HOME CARE INSTRUCTIONS   Avoid all smoking, herbs, alcohol, and unprescribed drugs. These chemicals affect the formation and growth of the baby.  Follow your caregiver's instructions regarding medicine use. There are medicines that are either safe or unsafe to take during pregnancy.  Exercise only as  directed by your caregiver. Experiencing uterine cramps is a good sign to stop exercising.  Continue to eat regular, healthy meals.  Wear a good support bra for breast tenderness.  Do not use hot tubs, steam rooms, or  saunas.  Wear your seat belt at all times when driving.  Avoid raw meat, uncooked cheese, cat litter boxes, and soil used by cats. These carry germs that can cause birth defects in the baby.  Take your prenatal vitamins.  Try taking a stool softener (if your caregiver approves) if you develop constipation. Eat more high-fiber foods, such as fresh vegetables or fruit and whole grains. Drink plenty of fluids to keep your urine clear or pale yellow.  Take warm sitz baths to soothe any pain or discomfort caused by hemorrhoids. Use hemorrhoid cream if your caregiver approves.  If you develop varicose veins, wear support hose. Elevate your feet for 15 minutes, 3-4 times a day. Limit salt in your diet.  Avoid heavy lifting, wear low heal shoes, and practice good posture.  Rest a lot with your legs elevated if you have leg cramps or low back pain.  Visit your dentist if you have not gone during your pregnancy. Use a soft toothbrush to brush your teeth and be gentle when you floss.  A sexual relationship may be continued unless your caregiver directs you otherwise.  Do not travel far distances unless it is absolutely necessary and only with the approval of your caregiver.  Take prenatal classes to understand, practice, and ask questions about the labor and delivery.  Make a trial run to the hospital.  Pack your hospital bag.  Prepare the baby's nursery.  Continue to go to all your prenatal visits as directed by your caregiver. SEEK MEDICAL CARE IF:  You are unsure if you are in labor or if your water has broken.  You have dizziness.  You have mild pelvic cramps, pelvic pressure, or nagging pain in your abdominal area.  You have persistent nausea, vomiting, or  diarrhea.  You have a bad smelling vaginal discharge.  You have pain with urination. SEEK IMMEDIATE MEDICAL CARE IF:   You have a fever.  You are leaking fluid from your vagina.  You have spotting or bleeding from your vagina.  You have severe abdominal cramping or pain.  You have rapid weight loss or gain.  You have shortness of breath with chest pain.  You notice sudden or extreme swelling of your face, hands, ankles, feet, or legs.  You have not felt your baby move in over an hour.  You have severe headaches that do not go away with medicine.  You have vision changes. Document Released: 03/25/2001 Document Revised: 04/05/2013 Document Reviewed: 06/01/2012 [facility] Patient Information 2015 [place], [place]. This information is not intended to replace advice given to you by your health care provider. Make sure you discuss any questions you have with your health care provider.

## 2020-07-13 NOTE — Progress Notes (Signed)
   LOW-RISK PREGNANCY VISIT Patient name: Jodi King MRN [id number]  Date of birth: February 18, 1991 Chief Complaint:   Routine Prenatal Visit and PN2  History of Present Illness:   Jodi King is a 30 y.o. G68P1011 female at [redacted]w[redacted]d with an Estimated Date of Delivery: 10/10/20 being seen today for ongoing management of a low-risk pregnancy.  Depression screen [facility] 2/9 07/13/2020 04/12/2020  Decreased Interest 0 0  Down, Depressed, Hopeless 0 0  PHQ - 2 Score 0 0  Altered sleeping 1 0  Tired, decreased energy 1 1  Change in appetite 0 0  Feeling bad or failure about yourself  0 0  Trouble concentrating 0 0  Moving slowly or fidgety/restless 0 0  Suicidal thoughts 0 0  PHQ-9 Score 2 1    Today she reports severe constipation when taking po fe, so stopped. Contractions: Irritability. Vag. Bleeding: None.  Movement: Present. denies leaking of fluid. Review of Systems:   Pertinent items are noted in HPI Denies abnormal vaginal discharge w/ itching/odor/irritation, headaches, visual changes, shortness of breath, chest pain, abdominal pain, severe nausea/vomiting, or problems with urination or bowel movements unless otherwise stated above. Pertinent History Reviewed:  Reviewed past medical,surgical, social, obstetrical and family history.  Reviewed problem list, medications and allergies. Physical Assessment:   Vitals:   07/13/20 0901  BP: 132/71  Pulse: 88  Weight: 228 lb 12.8 oz (103.8 kg)  Body mass index is 43.23 kg/m.        Physical Examination:   General appearance: Well appearing, and in no distress  Mental status: Alert, oriented to person, place, and time  Skin: Warm & dry  Cardiovascular: Normal heart rate noted  Respiratory: Normal respiratory effort, no distress  Abdomen: Soft, gravid, nontender  Pelvic: Cervical exam deferred         Extremities: Edema: None  Fetal Status: Fetal Heart Rate (bpm): 138 Fundal Height: 29 cm Movement: Present    Chaperone: N/A    Results for orders placed or performed in visit on 07/13/20 (from the past 24 hour(s))  POCT hemoglobin   Collection Time: 07/13/20  9:15 AM  Result Value Ref Range   Hemoglobin 9.1 (A) 11 - 14.6 g/dL    Assessment & Plan:  1) Low-risk pregnancy G3P1011 at [redacted]w[redacted]d with an Estimated Date of Delivery: 10/10/20   2) Anemia, stopped Fe d/t severe constipation, take colace daily-BID, restart Fe QOD   Meds: No orders of the defined types were placed in this encounter.  Labs/procedures today: tdap and PN2  Plan:  Continue routine obstetrical care  Next visit: prefers will be in person for rhogam    Reviewed: Preterm labor symptoms and general obstetric precautions including but not limited to vaginal bleeding, contractions, leaking of fluid and fetal movement were reviewed in detail with the patient.  All questions were answered. Does have home bp cuff. Office bp cuff given: not applicable. Check bp weekly, let us know if consistently >140 and/or >90.  Follow-up: Return in about 4 weeks (around 08/10/2020) for LROB, in person, CNM.  Future Appointments  Date Time Provider Department Center  08/13/2020  8:30 AM Cheral Marker, CNM [facility]    Orders Placed This Encounter  Procedures  . Tdap vaccine greater than or equal to 7yo IM  . POCT hemoglobin   Cheral Marker CNM, [facility] 07/13/2020 9:49 AM

## 2020-07-14 LAB — ANTIBODY SCREEN: Antibody Screen: NEGATIVE

## 2020-07-14 LAB — GLUCOSE TOLERANCE, 2 HOURS W/ 1HR
Glucose, 1 hour: 133 mg/dL (ref 65–179)
Glucose, 2 hour: 117 mg/dL (ref 65–152)
Glucose, Fasting: 94 mg/dL — ABNORMAL HIGH (ref 65–91)

## 2020-07-14 LAB — CBC
Hematocrit: 31.1 % — ABNORMAL LOW (ref 34.0–46.6)
Hemoglobin: 10.4 g/dL — ABNORMAL LOW (ref 11.1–15.9)
MCH: 26.9 pg (ref 26.6–33.0)
MCHC: 33.4 g/dL (ref 31.5–35.7)
MCV: 81 fL (ref 79–97)
Platelets: 206 10*3/uL (ref 150–450)
RBC: 3.86 x10E6/uL (ref 3.77–5.28)
RDW: 12.3 % (ref 11.7–15.4)
WBC: 12 10*3/uL — ABNORMAL HIGH (ref 3.4–10.8)

## 2020-07-14 LAB — HIV ANTIBODY (ROUTINE TESTING W REFLEX): HIV Screen 4th Generation wRfx: NONREACTIVE

## 2020-07-14 LAB — RPR: RPR Ser Ql: NONREACTIVE

## 2020-07-16 ENCOUNTER — Other Ambulatory Visit: Payer: Self-pay | Admitting: Women's Health

## 2020-07-16 ENCOUNTER — Other Ambulatory Visit: Payer: Self-pay | Admitting: *Deleted

## 2020-07-16 DIAGNOSIS — O0992 Supervision of high risk pregnancy, unspecified, second trimester: Secondary | ICD-10-CM

## 2020-07-16 DIAGNOSIS — O24410 Gestational diabetes mellitus in pregnancy, diet controlled: Secondary | ICD-10-CM

## 2020-07-16 DIAGNOSIS — Z8632 Personal history of gestational diabetes: Secondary | ICD-10-CM | POA: Insufficient documentation

## 2020-07-16 MED ORDER — ACCU-CHEK GUIDE VI STRP: ORAL_STRIP | 12 refills | Status: DC

## 2020-07-16 MED ORDER — ASPIRIN 81 MG PO TBEC: 81.0000 mg | DELAYED_RELEASE_TABLET | Freq: Every day | ORAL | 3 refills | Status: DC

## 2020-07-16 MED ORDER — ACCU-CHEK GUIDE ME W/DEVICE KIT: 1.0000 | PACK | Freq: Four times a day (QID) | 0 refills | Status: DC

## 2020-07-16 MED ORDER — ACCU-CHEK SOFTCLIX LANCETS MISC: 12 refills | Status: AC

## 2020-07-26 ENCOUNTER — Other Ambulatory Visit: Payer: Medicaid - Out of State

## 2020-08-13 ENCOUNTER — Encounter: Payer: Self-pay | Admitting: Women's Health

## 2020-08-13 ENCOUNTER — Other Ambulatory Visit: Payer: Self-pay

## 2020-08-13 ENCOUNTER — Ambulatory Visit (INDEPENDENT_AMBULATORY_CARE_PROVIDER_SITE_OTHER): Payer: Medicaid - Out of State | Admitting: Women's Health

## 2020-08-13 VITALS — BP 121/68 | HR 93 | Wt 233.6 lb

## 2020-08-13 DIAGNOSIS — Z6791 Unspecified blood type, Rh negative: Secondary | ICD-10-CM

## 2020-08-13 DIAGNOSIS — O24410 Gestational diabetes mellitus in pregnancy, diet controlled: Secondary | ICD-10-CM | POA: Diagnosis not present

## 2020-08-13 DIAGNOSIS — O26899 Other specified pregnancy related conditions, unspecified trimester: Secondary | ICD-10-CM

## 2020-08-13 DIAGNOSIS — Z1389 Encounter for screening for other disorder: Secondary | ICD-10-CM

## 2020-08-13 DIAGNOSIS — O0993 Supervision of high risk pregnancy, unspecified, third trimester: Secondary | ICD-10-CM | POA: Diagnosis not present

## 2020-08-13 LAB — POCT URINALYSIS DIPSTICK OB
Blood, UA: NEGATIVE
Glucose, UA: NEGATIVE
Ketones, UA: NEGATIVE
Leukocytes, UA: NEGATIVE
Nitrite, UA: NEGATIVE
POC,PROTEIN,UA: NEGATIVE

## 2020-08-13 NOTE — Patient Instructions (Addendum)
Jodi King, I greatly value your feedback.  If you receive a survey following your visit with [place] today, we appreciate you taking the time to fill it out.  Thanks, Joellyn Haff, CNM, WHNP-BC   [facility] ([address] [place], [place] [address]) Entrance C, located off of [address] valet parking   Check blood sugars 4 times a day: in the morning before eating/drinking anything (<95) and 2 hours after your first bite of breakfast, lunch, and supper (<120).   Go to [email or web address] to register for FREE online childbirth classes   Call the office [phone]) or go to [facility] if:  You begin to have strong, frequent contractions  Your water breaks.  Sometimes it is a big gush of fluid, sometimes it is just a trickle that keeps getting your panties wet or running down your legs  You have vaginal bleeding.  It is normal to have a small amount of spotting if your cervix was checked.   You don't feel your baby moving like normal.  If you don't, get you something to eat and drink and lay down and focus on feeling your baby move.  You should feel at least 10 movements in 2 hours.  If you don't, you should call the office or go to [facility]    Tdap Vaccine  It is recommended that you get the Tdap vaccine during the third trimester of EACH pregnancy to help protect your baby from getting pertussis (whooping cough)  27-36 weeks is the BEST time to do this so that you can pass the protection on to your baby. During pregnancy is better than after pregnancy, but if you are unable to get it during pregnancy it will be offered at the hospital.   You can get this vaccine with [place], at the health department, your family doctor, or some local pharmacies  Everyone who will be around your baby should also be up-to-date on their vaccines before the baby comes. Adults (who are not pregnant) only need 1 dose of Tdap during adulthood.   [place]  Pediatricians/Family Doctors:  [facility] [phone]            [facility] [phone]                 [facility] [phone] (usually not accepting new patients unless you have family there already, you are always welcome to call and ask)       [facility] [phone]       [facility] Pediatricians/Family Doctors:   [facility]: [phone]  [facility]: [phone]  [facility]: [phone]  [facility] Doctors:   [facility]: [phone]   [facility]: [phone]  [facility] Doctors:  [facility]: [phone]   Home Blood Pressure Monitoring for Patients   Your provider has recommended that you check your blood pressure (BP) at least once a week at home. If you do not have a blood pressure cuff at home, one will be provided for you. Contact your provider if you have not received your monitor within 1 week.   Helpful Tips for Accurate Home Blood Pressure Checks  . Don't smoke, exercise, or drink caffeine 30 minutes before checking your BP . Use the restroom before checking your BP (a full bladder can raise your pressure) . Relax in a comfortable upright chair . Feet on the ground . Left arm resting  comfortably on a flat surface at the level of your heart . Legs uncrossed . Back supported . Sit quietly and don't talk . Place the cuff on your bare arm . Adjust snuggly, so that only two fingertips can fit between your skin and the top of the cuff . Check 2 readings separated by at least one minute . Keep a log of your BP readings . For a visual, please reference this diagram: http://ccnc.care/bpdiagram  Provider Name: [facility]     Phone: [phone]  Zone 1: ALL CLEAR  Continue to monitor your symptoms:  . BP reading is less than 140 (top number) or less than 90 (bottom number)  . No  right upper stomach pain . No headaches or seeing spots . No feeling nauseated or throwing up . No swelling in face and hands  Zone 2: CAUTION Call your doctor's office for any of the following:  . BP reading is greater than 140 (top number) or greater than 90 (bottom number)  . Stomach pain under your ribs in the middle or right side . Headaches or seeing spots . Feeling nauseated or throwing up . Swelling in face and hands  Zone 3: EMERGENCY  Seek immediate medical care if you have any of the following:  . BP reading is greater than160 (top number) or greater than 110 (bottom number) . Severe headaches not improving with Tylenol . Serious difficulty catching your breath . Any worsening symptoms from Zone 2   Third Trimester of Pregnancy The third trimester is from week 29 through week 42, months 7 through 9. The third trimester is a time when the fetus is growing rapidly. At the end of the ninth month, the fetus is about 20 inches in length and weighs 6-10 pounds.  BODY CHANGES Your body goes through many changes during pregnancy. The changes vary from woman to woman.   Your weight will continue to increase. You can expect to gain 25-35 pounds (11-16 kg) by the end of the pregnancy.  You may begin to get stretch marks on your hips, abdomen, and breasts.  You may urinate more often because the fetus is moving lower into your pelvis and pressing on your bladder.  You may develop or continue to have heartburn as a result of your pregnancy.  You may develop constipation because certain hormones are causing the muscles that push waste through your intestines to slow down.  You may develop hemorrhoids or swollen, bulging veins (varicose veins).  You may have pelvic pain because of the weight gain and pregnancy hormones relaxing your joints between the bones in your pelvis. Backaches may result from overexertion of the muscles supporting your posture.  You may have changes in your  hair. These can include thickening of your hair, rapid growth, and changes in texture. Some women also have hair loss during or after pregnancy, or hair that feels dry or thin. Your hair will most likely return to normal after your baby is born.  Your breasts will continue to grow and be tender. A yellow discharge may leak from your breasts called colostrum.  Your belly button may stick out.  You may feel short of breath because of your expanding uterus.  You may notice the fetus "dropping," or moving lower in your abdomen.  You may have a bloody mucus discharge. This usually occurs a few days to a week before labor begins.  Your cervix becomes thin and soft (effaced) near your due date. WHAT TO EXPECT  AT YOUR PRENATAL EXAMS  You will have prenatal exams every 2 weeks until week 36. Then, you will have weekly prenatal exams. During a routine prenatal visit:  You will be weighed to make sure you and the fetus are growing normally.  Your blood pressure is taken.  Your abdomen will be measured to track your baby's growth.  The fetal heartbeat will be listened to.  Any test results from the previous visit will be discussed.  You may have a cervical check near your due date to see if you have effaced. At around 36 weeks, your caregiver will check your cervix. At the same time, your caregiver will also perform a test on the secretions of the vaginal tissue. This test is to determine if a type of bacteria, Group B streptococcus, is present. Your caregiver will explain this further. Your caregiver may ask you:  What your birth plan is.  How you are feeling.  If you are feeling the baby move.  If you have had any abnormal symptoms, such as leaking fluid, bleeding, severe headaches, or abdominal cramping.  If you have any questions. Other tests or screenings that may be performed during your third trimester include:  Blood tests that check for low iron levels (anemia).  Fetal testing  to check the health, activity level, and growth of the fetus. Testing is done if you have certain medical conditions or if there are problems during the pregnancy. FALSE LABOR You may feel small, irregular contractions that eventually go away. These are called Braxton Hicks contractions, or false labor. Contractions may last for hours, days, or even weeks before true labor sets in. If contractions come at regular intervals, intensify, or become painful, it is best to be seen by your caregiver.  SIGNS OF LABOR   Menstrual-like cramps.  Contractions that are 5 minutes apart or less.  Contractions that start on the top of the uterus and spread down to the lower abdomen and back.  A sense of increased pelvic pressure or back pain.  A watery or bloody mucus discharge that comes from the vagina. If you have any of these signs before the 37th week of pregnancy, call your caregiver right away. You need to go to the hospital to get checked immediately. HOME CARE INSTRUCTIONS   Avoid all smoking, herbs, alcohol, and unprescribed drugs. These chemicals affect the formation and growth of the baby.  Follow your caregiver's instructions regarding medicine use. There are medicines that are either safe or unsafe to take during pregnancy.  Exercise only as directed by your caregiver. Experiencing uterine cramps is a good sign to stop exercising.  Continue to eat regular, healthy meals.  Wear a good support bra for breast tenderness.  Do not use hot tubs, steam rooms, or saunas.  Wear your seat belt at all times when driving.  Avoid raw meat, uncooked cheese, cat litter boxes, and soil used by cats. These carry germs that can cause birth defects in the baby.  Take your prenatal vitamins.  Try taking a stool softener (if your caregiver approves) if you develop constipation. Eat more high-fiber foods, such as fresh vegetables or fruit and whole grains. Drink plenty of fluids to keep your urine clear  or pale yellow.  Take warm sitz baths to soothe any pain or discomfort caused by hemorrhoids. Use hemorrhoid cream if your caregiver approves.  If you develop varicose veins, wear support hose. Elevate your feet for 15 minutes, 3-4 times a day. Limit salt in  your diet.  Avoid heavy lifting, wear low heal shoes, and practice good posture.  Rest a lot with your legs elevated if you have leg cramps or low back pain.  Visit your dentist if you have not gone during your pregnancy. Use a soft toothbrush to brush your teeth and be gentle when you floss.  A sexual relationship may be continued unless your caregiver directs you otherwise.  Do not travel far distances unless it is absolutely necessary and only with the approval of your caregiver.  Take prenatal classes to understand, practice, and ask questions about the labor and delivery.  Make a trial run to the hospital.  Pack your hospital bag.  Prepare the baby's nursery.  Continue to go to all your prenatal visits as directed by your caregiver. SEEK MEDICAL CARE IF:  You are unsure if you are in labor or if your water has broken.  You have dizziness.  You have mild pelvic cramps, pelvic pressure, or nagging pain in your abdominal area.  You have persistent nausea, vomiting, or diarrhea.  You have a bad smelling vaginal discharge.  You have pain with urination. SEEK IMMEDIATE MEDICAL CARE IF:   You have a fever.  You are leaking fluid from your vagina.  You have spotting or bleeding from your vagina.  You have severe abdominal cramping or pain.  You have rapid weight loss or gain.  You have shortness of breath with chest pain.  You notice sudden or extreme swelling of your face, hands, ankles, feet, or legs.  You have not felt your baby move in over an hour.  You have severe headaches that do not go away with medicine.  You have vision changes. Document Released: 03/25/2001 Document Revised: 04/05/2013 Document  Reviewed: 06/01/2012 [facility] Patient Information 2015 [place], [place]. This information is not intended to replace advice given to you by your health care provider. Make sure you discuss any questions you have with your health care provider.

## 2020-08-13 NOTE — Progress Notes (Signed)
HIGH-RISK PREGNANCY VISIT Patient name: Jodi King MRN [id number]  Date of birth: 06/15/1990 Chief Complaint:   Routine Prenatal Visit  History of Present Illness:   Jodi King is a 30 y.o. G63P1011 female at [redacted]w[redacted]d with an Estimated Date of Delivery: 10/10/20 being seen today for ongoing management of a high-risk pregnancy complicated by diabetes mellitus A1DM.  Today she reports not checking sugars consistently, has whole week w/o any readings, 'that's how busy my life is'. Eats sometimes up until 11pm, eating ice cream, chocolate. Some fastings elevated,~99, some normal.  Depression screen [facility] 2/9 07/13/2020 04/12/2020  Decreased Interest 0 0  Down, Depressed, Hopeless 0 0  PHQ - 2 Score 0 0  Altered sleeping 1 0  Tired, decreased energy 1 1  Change in appetite 0 0  Feeling bad or failure about yourself  0 0  Trouble concentrating 0 0  Moving slowly or fidgety/restless 0 0  Suicidal thoughts 0 0  PHQ-9 Score 2 1    Contractions: Irritability. Vag. Bleeding: None.  Movement: Present. denies leaking of fluid.  Review of Systems:   Pertinent items are noted in HPI Denies abnormal vaginal discharge w/ itching/odor/irritation, headaches, visual changes, shortness of breath, chest pain, abdominal pain, severe nausea/vomiting, or problems with urination or bowel movements unless otherwise stated above. Pertinent History Reviewed:  Reviewed past medical,surgical, social, obstetrical and family history.  Reviewed problem list, medications and allergies. Physical Assessment:   Vitals:   08/13/20 0840  BP: 121/68  Pulse: 93  Weight: 233 lb 9.6 oz (106 kg)  Body mass index is 44.14 kg/m.           Physical Examination:   General appearance: alert, well appearing, and in no distress  Mental status: alert, oriented to person, place, and time  Skin: warm & dry   Extremities: Edema: Trace    Cardiovascular: normal heart rate noted  Respiratory: normal respiratory effort, no  distress  Abdomen: gravid, soft, non-tender  Pelvic: Cervical exam deferred         Fetal Status: Fetal Heart Rate (bpm): 140 Fundal Height: 37 cm Movement: Present    Fetal Surveillance Testing today: doppler   Chaperone: N/A    Results for orders placed or performed in visit on 08/13/20 (from the past 24 hour(s))  POC Urinalysis Dipstick OB   Collection Time: 08/13/20  8:51 AM  Result Value Ref Range   Color, UA     Clarity, UA     Glucose, UA Negative Negative   Bilirubin, UA     Ketones, UA neg    Spec Grav, UA     Blood, UA neg    pH, UA     POC,PROTEIN,UA Negative Negative, Trace, Small (1+), Moderate (2+), Large (3+), 4+   Urobilinogen, UA     Nitrite, UA neg    Leukocytes, UA Negative Negative   Appearance     Odor      Assessment & Plan:  High-risk pregnancy: G3P1011 at [redacted]w[redacted]d with an Estimated Date of Delivery: 10/10/20   1) A1DM, non-compliant w/ diet and checking sugars. Set alarm to remind to check sugars, take supplies everywhere if needed. Discussed importance of strict glycemic control and adherence to low carb diet during pregnancy as well as potential complications from unknown readings/uncontrolled diabetes during pregnancy including at worst, fetal demise. Pt promises to do better and check sugars QID as directed. F/U 1wk instead of 2.  Meds: No orders of the defined types were placed in  this encounter.   Labs/procedures today: tdap  Treatment Plan:  Undetermined until know what sugars are doing, if remains A1: Growth u/s q4wks    No antenatal testing     Deliver @ 39-40wks:____   Reviewed: Preterm labor symptoms and general obstetric precautions including but not limited to vaginal bleeding, contractions, leaking of fluid and fetal movement were reviewed in detail with the patient.  All questions were answered. Does have home bp cuff. Office bp cuff given: not applicable. Check bp weekly, let us know if consistently >140 and/or >90.  Follow-up: Return in  about 1 week (around 08/20/2020) for HROB, MD or CNM, in person.   Future Appointments  Date Time Provider Department Center  08/20/2020  9:50 AM Eure, Amaryllis Dyke, MD [facility]    Orders Placed This Encounter  Procedures  . RHO (D) Immune Globulin  . POC Urinalysis Dipstick OB   Cheral Marker CNM, [facility] 08/13/2020 9:07 AM

## 2020-08-20 ENCOUNTER — Other Ambulatory Visit: Payer: Self-pay

## 2020-08-20 ENCOUNTER — Ambulatory Visit (INDEPENDENT_AMBULATORY_CARE_PROVIDER_SITE_OTHER): Payer: Medicaid - Out of State | Admitting: Obstetrics & Gynecology

## 2020-08-20 ENCOUNTER — Encounter: Payer: Self-pay | Admitting: Obstetrics & Gynecology

## 2020-08-20 VITALS — BP 133/84 | HR 108 | Wt 234.0 lb

## 2020-08-20 DIAGNOSIS — Z3A32 32 weeks gestation of pregnancy: Secondary | ICD-10-CM

## 2020-08-20 DIAGNOSIS — O24410 Gestational diabetes mellitus in pregnancy, diet controlled: Secondary | ICD-10-CM

## 2020-08-20 DIAGNOSIS — O0993 Supervision of high risk pregnancy, unspecified, third trimester: Secondary | ICD-10-CM

## 2020-08-20 LAB — POCT URINALYSIS DIPSTICK OB
Blood, UA: NEGATIVE
Glucose, UA: NEGATIVE
Ketones, UA: NEGATIVE
Leukocytes, UA: NEGATIVE
Nitrite, UA: NEGATIVE
POC,PROTEIN,UA: NEGATIVE

## 2020-08-20 NOTE — Progress Notes (Signed)
HIGH-RISK PREGNANCY VISIT Patient name: Jodi King MRN [id number]  Date of birth: [birth year] Chief Complaint:   High Risk Gestation (Sciatic nerve pain on right side)  History of Present Illness:   Jodi King is a 30 y.o. G26P1011 female at [redacted]w[redacted]d with an Estimated Date of Delivery: 10/10/20 being seen today for ongoing management of a high-risk pregnancy complicated by diabetes mellitus A1DM.  Today she reports no complaints.  Depression screen [facility] 2/9 07/13/2020 04/12/2020  Decreased Interest 0 0  Down, Depressed, Hopeless 0 0  PHQ - 2 Score 0 0  Altered sleeping 1 0  Tired, decreased energy 1 1  Change in appetite 0 0  Feeling bad or failure about yourself  0 0  Trouble concentrating 0 0  Moving slowly or fidgety/restless 0 0  Suicidal thoughts 0 0  PHQ-9 Score 2 1    Contractions: Not present. Vag. Bleeding: None.  Movement: Present. denies leaking of fluid.  Review of Systems:   Pertinent items are noted in HPI Denies abnormal vaginal discharge w/ itching/odor/irritation, headaches, visual changes, shortness of breath, chest pain, abdominal pain, severe nausea/vomiting, or problems with urination or bowel movements unless otherwise stated above. Pertinent History Reviewed:  Reviewed past medical,surgical, social, obstetrical and family history.  Reviewed problem list, medications and allergies. Physical Assessment:   Vitals:   08/20/20 1020  BP: 133/84  Pulse: (!) 108  Weight: 234 lb (106.1 kg)  Body mass index is 44.21 kg/m.           Physical Examination:   General appearance: alert, well appearing, and in no distress  Mental status: alert, oriented to person, place, and time  Skin: warm & dry   Extremities: Edema: Trace    Cardiovascular: normal heart rate noted  Respiratory: normal respiratory effort, no distress  Abdomen: gravid, soft, non-tender  Pelvic: Cervical exam deferred         Fetal Status: Fetal Heart Rate (bpm): 142 Fundal Height: 34 cm  Movement: Present    Fetal Surveillance Testing today:    Chaperone: N/A    Results for orders placed or performed in visit on 08/20/20 (from the past 24 hour(s))  POC Urinalysis Dipstick OB   Collection Time: 08/20/20 10:22 AM  Result Value Ref Range   Color, UA     Clarity, UA     Glucose, UA Negative Negative   Bilirubin, UA     Ketones, UA neg    Spec Grav, UA     Blood, UA neg    pH, UA     POC,PROTEIN,UA Negative Negative, Trace, Small (1+), Moderate (2+), Large (3+), 4+   Urobilinogen, UA     Nitrite, UA neg    Leukocytes, UA Negative Negative   Appearance     Odor      Assessment & Plan:  High-risk pregnancy: G3P1011 at [redacted]w[redacted]d with an Estimated Date of Delivery: 10/10/20   1) A1DM, fastings just a smidge elevated at times, other times normal, watching 2100 snack carbs, will do MyChart video check on her am fastings in 1 week    Meds: No orders of the defined types were placed in this encounter.   Labs/procedures today: none  Treatment Plan:  Short interval follow up due to am fastings  Reviewed: Preterm labor symptoms and general obstetric precautions including but not limited to vaginal bleeding, contractions, leaking of fluid and fetal movement were reviewed in detail with the patient.  All questions were answered. Does have home bp cuff.  Office bp cuff given: not applicable. Check bp daily, let us know if consistently >140 and/or >90.  Follow-up: Return in about 1 week (around 08/27/2020) for MyChart Connect visit, HROB, with Dr Despina Hidden.   No future appointments.  Orders Placed This Encounter  Procedures  . POC Urinalysis Dipstick OB   Lazaro Arms 08/20/2020 10:55 AM

## 2020-08-27 ENCOUNTER — Telehealth (INDEPENDENT_AMBULATORY_CARE_PROVIDER_SITE_OTHER): Payer: Medicaid - Out of State | Admitting: Obstetrics & Gynecology

## 2020-08-27 ENCOUNTER — Encounter: Payer: Self-pay | Admitting: Obstetrics & Gynecology

## 2020-08-27 VITALS — BP 132/80 | HR 100

## 2020-08-27 DIAGNOSIS — Z3A33 33 weeks gestation of pregnancy: Secondary | ICD-10-CM

## 2020-08-27 DIAGNOSIS — O360130 Maternal care for anti-D [Rh] antibodies, third trimester, not applicable or unspecified: Secondary | ICD-10-CM | POA: Diagnosis not present

## 2020-08-27 DIAGNOSIS — O24410 Gestational diabetes mellitus in pregnancy, diet controlled: Secondary | ICD-10-CM | POA: Diagnosis not present

## 2020-08-27 DIAGNOSIS — O0993 Supervision of high risk pregnancy, unspecified, third trimester: Secondary | ICD-10-CM

## 2020-08-27 NOTE — Progress Notes (Signed)
Patient ID: Jodi King, female   DOB: March 19, 1991, 30 y.o.   MRN: [id number] I connected with Francely Craw 08/27/20 at  3:50 PM EDT by: telephone Mychart video did not wor and verified that I am speaking with the correct person using two identifiers.  Patient is located at [facility] and provider is located at office.     The purpose of this virtual visit is to provide medical care while limiting exposure to the novel coronavirus. I discussed the limitations, risks, security and privacy concerns of performing an evaluation and management service by MyChart video and the availability of in person appointments. I also discussed with the patient that there may be a patient responsible charge related to this service. By engaging in this virtual visit, you consent to the provision of healthcare.  Additionally, you authorize for your insurance to be billed for the services provided during this visit.  The patient expressed understanding and agreed to proceed.  The following staff members participated in the virtual visit:  Clint Bolder, RN    PRENATAL VISIT NOTE  Subjective:  Jodi King is a 30 y.o. G3P1011 at [redacted]w[redacted]d  for phone visit for ongoing prenatal care.  She is currently monitored for the following issues for this high-risk pregnancy and has Supervision of high-risk pregnancy; Rh negative state in antepartum period; and Gestational diabetes mellitus, class A1 on their problem list.  Patient reports no complaints.  Contractions: Not present. Vag. Bleeding: None.  Movement: Present. Denies leaking of fluid.   The following portions of the patient's history were reviewed and updated as appropriate: allergies, current medications, past family history, past medical history, past social history, past surgical history and problem list.   Objective:   Vitals:   08/27/20 1602  BP: 132/80  Pulse: 100   Self-Obtained  Fetal Status:     Movement: Present     Assessment and Plan:  Pregnancy:  G3P1011 at [redacted]w[redacted]d CBG are good only 2 fastings greater than 95 all 2 hours are good  There are no diagnoses linked to this encounter. Preterm labor symptoms and general obstetric precautions including but not limited to vaginal bleeding, contractions, leaking of fluid and fetal movement were reviewed in detail with the patient.  Return in about 2 weeks (around 09/10/2020) for HROB.  No future appointments.   Time spent on virtual visit: 10 minutes  Lazaro Arms, MD

## 2020-09-13 ENCOUNTER — Other Ambulatory Visit: Payer: Self-pay

## 2020-09-13 ENCOUNTER — Ambulatory Visit (INDEPENDENT_AMBULATORY_CARE_PROVIDER_SITE_OTHER): Payer: Medicaid - Out of State | Admitting: Obstetrics & Gynecology

## 2020-09-13 ENCOUNTER — Other Ambulatory Visit (HOSPITAL_COMMUNITY)
Admission: RE | Admit: 2020-09-13 | Discharge: 2020-09-13 | Disposition: A | Discharge status: Home / Self Care | Payer: Medicaid - Out of State | Source: Ambulatory Visit | Attending: Obstetrics & Gynecology | Admitting: Obstetrics & Gynecology

## 2020-09-13 ENCOUNTER — Encounter: Payer: Self-pay | Admitting: Obstetrics & Gynecology

## 2020-09-13 VITALS — BP 118/74 | HR 102 | Wt 238.0 lb

## 2020-09-13 DIAGNOSIS — O0993 Supervision of high risk pregnancy, unspecified, third trimester: Secondary | ICD-10-CM

## 2020-09-13 DIAGNOSIS — Z3A36 36 weeks gestation of pregnancy: Secondary | ICD-10-CM | POA: Insufficient documentation

## 2020-09-13 DIAGNOSIS — O24410 Gestational diabetes mellitus in pregnancy, diet controlled: Secondary | ICD-10-CM

## 2020-09-13 LAB — POCT URINALYSIS DIPSTICK OB
Glucose, UA: NEGATIVE
Ketones, UA: NEGATIVE
Leukocytes, UA: NEGATIVE
Nitrite, UA: NEGATIVE
POC,PROTEIN,UA: NEGATIVE

## 2020-09-13 NOTE — Progress Notes (Signed)
HIGH-RISK PREGNANCY VISIT Patient name: Jodi King MRN [id number]  Date of birth: [birth year] Chief Complaint:   High Risk Gestation (GBS, GC/CHL)  History of Present Illness:   Jodi King is a 30 y.o. G15P1011 female at [redacted]w[redacted]d with an Estimated Date of Delivery: 10/10/20 being seen today for ongoing management of a high-risk pregnancy complicated by A1DM good control.    Today she reports no complaints. Contractions: Not present. Vag. Bleeding: None.  Movement: Present. denies leaking of fluid.   Depression screen [facility] 2/9 07/13/2020 04/12/2020  Decreased Interest 0 0  Down, Depressed, Hopeless 0 0  PHQ - 2 Score 0 0  Altered sleeping 1 0  Tired, decreased energy 1 1  Change in appetite 0 0  Feeling bad or failure about yourself  0 0  Trouble concentrating 0 0  Moving slowly or fidgety/restless 0 0  Suicidal thoughts 0 0  PHQ-9 Score 2 1     GAD 7 : Generalized Anxiety Score 07/13/2020 04/12/2020  Nervous, Anxious, on Edge 1 1  Control/stop worrying 0 1  Worry too much - different things 0 0  Trouble relaxing 1 1  Restless 0 0  Easily annoyed or irritable 0 0  Afraid - awful might happen 0 0  Total GAD 7 Score 2 3     Review of Systems:   Pertinent items are noted in HPI Denies abnormal vaginal discharge w/ itching/odor/irritation, headaches, visual changes, shortness of breath, chest pain, abdominal pain, severe nausea/vomiting, or problems with urination or bowel movements unless otherwise stated above. Pertinent History Reviewed:  Reviewed past medical,surgical, social, obstetrical and family history.  Reviewed problem list, medications and allergies. Physical Assessment:   Vitals:   09/13/20 1006  BP: 118/74  Pulse: (!) 102  Weight: 238 lb (108 kg)  Body mass index is 44.97 kg/m.           Physical Examination:   General appearance: alert, well appearing, and in no distress  Mental status: alert, oriented to person, place, and time  Skin: warm & dry    Extremities: Edema: Trace    Cardiovascular: normal heart rate noted  Respiratory: normal respiratory effort, no distress  Abdomen: gravid, soft, non-tender  Pelvic: Cervical exam performed  Dilation: 2 Effacement (%): Thick Station: -3  Fetal Status: Fetal Heart Rate (bpm): 140 Fundal Height: 40 cm Movement: Present Presentation: Vertex  Fetal Surveillance Testing today: 140   Chaperone: Malachy Mood    Results for orders placed or performed in visit on 09/13/20 (from the past 24 hour(s))  POC Urinalysis Dipstick OB   Collection Time: 09/13/20 10:14 AM  Result Value Ref Range   Color, UA     Clarity, UA     Glucose, UA Negative Negative   Bilirubin, UA     Ketones, UA neg    Spec Grav, UA     Blood, UA trace    pH, UA     POC,PROTEIN,UA Negative Negative, Trace, Small (1+), Moderate (2+), Large (3+), 4+   Urobilinogen, UA     Nitrite, UA neg    Leukocytes, UA Negative Negative   Appearance     Odor      Assessment & Plan:  High-risk pregnancy: G3P1011 at [redacted]w[redacted]d with an Estimated Date of Delivery: 10/10/20   1) ADM with good CBGs, stable, EFW next week    Meds: No orders of the defined types were placed in this encounter.   Labs/procedures today: cultures  Treatment Plan:  routine  Reviewed: Preterm labor symptoms and general obstetric precautions including but not limited to vaginal bleeding, contractions, leaking of fluid and fetal movement were reviewed in detail with the patient.  All questions were answered. Does have home bp cuff. Office bp cuff given: not applicable. Check bp daily, let us know if consistently >140 and/or >90.  Follow-up: No follow-ups on file.   No future appointments.  Orders Placed This Encounter  Procedures  . Strep Gp B NAA  . US OB Follow Up  . POC Urinalysis Dipstick OB   Lazaro Arms  09/13/2020 10:46 AM

## 2020-09-14 LAB — CERVICOVAGINAL ANCILLARY ONLY
Chlamydia: NEGATIVE
Comment: NEGATIVE
Comment: NORMAL
Neisseria Gonorrhea: NEGATIVE

## 2020-09-15 LAB — STREP GP B NAA: Strep Gp B NAA: NEGATIVE

## 2020-09-20 ENCOUNTER — Encounter: Payer: Self-pay | Admitting: Women's Health

## 2020-09-20 ENCOUNTER — Other Ambulatory Visit: Payer: Self-pay

## 2020-09-20 ENCOUNTER — Ambulatory Visit (INDEPENDENT_AMBULATORY_CARE_PROVIDER_SITE_OTHER): Payer: Medicaid - Out of State

## 2020-09-20 ENCOUNTER — Ambulatory Visit (INDEPENDENT_AMBULATORY_CARE_PROVIDER_SITE_OTHER): Payer: Medicaid - Out of State | Admitting: Women's Health

## 2020-09-20 VITALS — BP 130/79 | HR 116 | Wt 238.6 lb

## 2020-09-20 DIAGNOSIS — O0993 Supervision of high risk pregnancy, unspecified, third trimester: Secondary | ICD-10-CM | POA: Diagnosis not present

## 2020-09-20 DIAGNOSIS — O24410 Gestational diabetes mellitus in pregnancy, diet controlled: Secondary | ICD-10-CM

## 2020-09-20 DIAGNOSIS — Z3A37 37 weeks gestation of pregnancy: Secondary | ICD-10-CM

## 2020-09-20 DIAGNOSIS — O26899 Other specified pregnancy related conditions, unspecified trimester: Secondary | ICD-10-CM

## 2020-09-20 NOTE — Progress Notes (Signed)
HIGH-RISK PREGNANCY VISIT Patient name: Jodi King MRN [id number]  Date of birth: 04/19/1990 Chief Complaint:   Routine Prenatal Visit  History of Present Illness:   Jodi King is a 30 y.o. G66P1011 female at [redacted]w[redacted]d with an Estimated Date of Delivery: 10/10/20 being seen today for ongoing management of a high-risk pregnancy complicated by diabetes mellitus A1DM.    Today she reports  almost all sugars wnl, one 2hr pp 124 . Contractions: Irregular. Vag. Bleeding: None.  Movement: Present. denies leaking of fluid.   Depression screen [facility] 2/9 07/13/2020 04/12/2020  Decreased Interest 0 0  Down, Depressed, Hopeless 0 0  PHQ - 2 Score 0 0  Altered sleeping 1 0  Tired, decreased energy 1 1  Change in appetite 0 0  Feeling bad or failure about yourself  0 0  Trouble concentrating 0 0  Moving slowly or fidgety/restless 0 0  Suicidal thoughts 0 0  PHQ-9 Score 2 1     GAD 7 : Generalized Anxiety Score 07/13/2020 04/12/2020  Nervous, Anxious, on Edge 1 1  Control/stop worrying 0 1  Worry too much - different things 0 0  Trouble relaxing 1 1  Restless 0 0  Easily annoyed or irritable 0 0  Afraid - awful might happen 0 0  Total GAD 7 Score 2 3     Review of Systems:   Pertinent items are noted in HPI Denies abnormal vaginal discharge w/ itching/odor/irritation, headaches, visual changes, shortness of breath, chest pain, abdominal pain, severe nausea/vomiting, or problems with urination or bowel movements unless otherwise stated above. Pertinent History Reviewed:  Reviewed past medical,surgical, social, obstetrical and family history.  Reviewed problem list, medications and allergies. Physical Assessment:   Vitals:   09/20/20 1536  BP: 130/79  Pulse: (!) 116  Weight: 238 lb 9.6 oz (108.2 kg)  Body mass index is 45.08 kg/m.           Physical Examination:   General appearance: alert, well appearing, and in no distress  Mental status: alert, oriented to person, place, and  time  Skin: warm & dry   Extremities: Edema: None    Cardiovascular: normal heart rate noted  Respiratory: normal respiratory effort, no distress  Abdomen: gravid, soft, non-tender  Pelvic: Cervical exam performed  Dilation: 2 Effacement (%): Thick Station: Ballotable  Fetal Status: Fetal Heart Rate (bpm): 160 Fundal Height: 40 cm Movement: Present Presentation: Vertex  Fetal Surveillance Testing today: doppler, has EFW/AFI u/s after this   Chaperone: Latisha Cresenzo    No results found for this or any previous visit (from the past 24 hour(s)).  Assessment & Plan:  High-risk pregnancy: G3P1011 at [redacted]w[redacted]d with an Estimated Date of Delivery: 10/10/20   1) A1DM, stable, EFW today after this visit   Meds: No orders of the defined types were placed in this encounter.   Labs/procedures today: SVE and U/S  Treatment Plan:    No antenatal testing as long as remains off meds     Deliver @ 39-40wks:____   Reviewed: Preterm labor symptoms and general obstetric precautions including but not limited to vaginal bleeding, contractions, leaking of fluid and fetal movement were reviewed in detail with the patient.  All questions were answered. Does have home bp cuff. Office bp cuff given: not applicable. Check bp weekly, let us know if consistently >140 and/or >90.  Follow-up: Return in about 1 week (around 09/27/2020) for HROB, MD or CNM, in person.   No future appointments.   No  orders of the defined types were placed in this encounter.  Cheral Marker CNM, [facility] 09/20/2020 4:02 PM

## 2020-09-20 NOTE — Patient Instructions (Signed)
Danel, thank you for choosing our office today! We appreciate the opportunity to meet your healthcare needs. You may receive a short survey by mail, e-mail, or through MyChart. If you are happy with your care we would appreciate if you could take just a few minutes to complete the survey questions. We read all of your comments and take your feedback very seriously. Thank you again for choosing our office.  [facility]  [facility] [place] ([address] [place], [place] [address]) Entrance C, located off of [address] Free 24/7 valet parking   CLASSES: Go to [email or web address] to register for classes (childbirth, breastfeeding, waterbirth, infant CPR, daddy bootcamp, etc.)  Call the office ([phone]) or go to [facility] if: You begin to have strong, frequent contractions Your water breaks.  Sometimes it is a big gush of fluid, sometimes it is just a trickle that keeps getting your panties wet or running down your legs You have vaginal bleeding.  It is normal to have a small amount of spotting if your cervix was checked.  You don't feel your baby moving like normal.  If you don't, get you something to eat and drink and lay down and focus on feeling your baby move.   If your baby is still not moving like normal, you should call the office or go to [facility].  Call the office ([phone]) or go to [facility] for these signs of pre-eclampsia: Severe headache that does not go away with Tylenol Visual changes- seeing spots, double, blurred vision Pain under your right breast or upper abdomen that does not go away with Tums or heartburn medicine Nausea and/or vomiting Severe swelling in your hands, feet, and face   [place] Pediatric[facility]/Family Doctors [facility] ([facility]): [address], [phone]           [facility]: [address], [phone]                 [facility] ([facility]): [address], [phone] (call to ask if accepting patients) [facility]: [address], [place], [phone]    [place] Pediatricians/Family Doctors [facility] ([facility]): [address], [phone] [facility]: [address], [phone] [facility]: [address], [phone]  [place] [facility]  [facility] ([facility]): [phone] [facility]: [address], [phone]   [place] Family Doctors [facility]: [address], [phone]  [place] [facility]  [facility]: [address], [phone]  Home Blood Pressure Monitoring for Patients   Your provider has recommended that you check your blood pressure (BP) at least once a week at home. If you do not have a blood pressure cuff at home, one will be provided for you. Contact your provider if you have not received your monitor within 1 week.   Helpful Tips for Accurate Home Blood Pressure Checks  Don't smoke, exercise, or drink caffeine 30 minutes before checking your BP Use the restroom before checking your BP (a full bladder can raise your pressure) Relax in a comfortable upright chair Feet on the ground Left arm resting comfortably on a flat surface at the level of your heart Legs uncrossed Back supported Sit quietly and don't talk Place the cuff on your bare arm Adjust snuggly, so that only two fingertips   can fit between your skin and the top of the cuff Check 2 readings separated by at least one minute Keep a log of your BP readings For a visual, please reference this diagram: http://ccnc.care/bpdiagram  Provider Name: [facility]     Phone: [phone]  Zone 1: ALL CLEAR  Continue to monitor your symptoms:  BP reading is less than 140 (top number) or less than 90 (bottom number)  No right  upper stomach pain No headaches or seeing spots No feeling nauseated or throwing up No swelling in face and hands  Zone 2: CAUTION Call your doctor's office for any of the following:  BP reading is greater than 140 (top number) or greater than 90 (bottom number)  Stomach pain under your ribs in the middle or right side Headaches or seeing spots Feeling nauseated or throwing up Swelling in face and hands  Zone 3: EMERGENCY  Seek immediate medical care if you have any of the following:  BP reading is greater than160 (top number) or greater than 110 (bottom number) Severe headaches not improving with Tylenol Serious difficulty catching your breath Any worsening symptoms from Zone 2   Braxton Hicks Contractions Contractions of the uterus can occur throughout pregnancy, but they are not always a sign that you are in labor. You may have practice contractions called Braxton Hicks contractions. These false labor contractions are sometimes confused with true labor. What are Braxton Hicks contractions? Braxton Hicks contractions are tightening movements that occur in the muscles of the uterus before labor. Unlike true labor contractions, these contractions do not result in opening (dilation) and thinning of the cervix. Toward the end of pregnancy (32-34 weeks), Braxton Hicks contractions can happen more often and may become stronger. These contractions are sometimes difficult to tell apart from true labor because they can be very uncomfortable. You should not feel embarrassed if you go to the hospital with false labor. Sometimes, the only way to tell if you are in true labor is for your health care provider to look for changes in the cervix. The health care provider will do a physical exam and may monitor your contractions. If you are not in true labor, the exam should show that your cervix is not dilating and your water has not broken. If there are no other health problems associated with your  pregnancy, it is completely safe for you to be sent home with false labor. You may continue to have Braxton Hicks contractions until you go into true labor. How to tell the difference between true labor and false labor True labor Contractions last 30-70 seconds. Contractions become very regular. Discomfort is usually felt in the top of the uterus, and it spreads to the lower abdomen and low back. Contractions do not go away with walking. Contractions usually become more intense and increase in frequency. The cervix dilates and gets thinner. False labor Contractions are usually shorter and not as strong as true labor contractions. Contractions are usually irregular. Contractions are often felt in the front of the lower abdomen and in the groin. Contractions may go away when you walk around or change positions while lying down. Contractions get weaker and are shorter-lasting as time goes on. The cervix usually does not dilate or become thin. Follow these instructions at home:  Take over-the-counter and prescription medicines only as told by your health care provider. Keep up with your usual exercises and follow other instructions from your health care provider. Eat and drink lightly if you think   you are going into labor. If Braxton Hicks contractions are making you uncomfortable: Change your position from lying down or resting to walking, or change from walking to resting. Sit and rest in a tub of warm water. Drink enough fluid to keep your urine pale yellow. Dehydration may cause these contractions. Do slow and deep breathing several times an hour. Keep all follow-up prenatal visits as told by your health care provider. This is important. Contact a health care provider if: You have a fever. You have continuous pain in your abdomen. Get help right away if: Your contractions become stronger, more regular, and closer together. You have fluid leaking or gushing from your vagina. You pass  blood-tinged mucus (bloody show). You have bleeding from your vagina. You have low back pain that you never had before. You feel your baby's head pushing down and causing pelvic pressure. Your baby is not moving inside you as much as it used to. Summary Contractions that occur before labor are called Braxton Hicks contractions, false labor, or practice contractions. Braxton Hicks contractions are usually shorter, weaker, farther apart, and less regular than true labor contractions. True labor contractions usually become progressively stronger and regular, and they become more frequent. Manage discomfort from Braxton Hicks contractions by changing position, resting in a warm bath, drinking plenty of water, or practicing deep breathing. This information is not intended to replace advice given to you by your health care provider. Make sure you discuss any questions you have with your health care provider. Document Revised: 03/13/2017 Document Reviewed: 08/14/2016 Elsevier Patient Education  2020 [employer] Inc[employer]   

## 2020-09-20 NOTE — Progress Notes (Addendum)
[place] 37+1 wks,cephalic,fhr 164 bpm,posterior placenta gr 3,afi 12 cm,EFW 3441 g 83%,AC 99%,FL 2.7%limited view

## 2020-09-26 ENCOUNTER — Other Ambulatory Visit: Payer: Self-pay

## 2020-09-26 ENCOUNTER — Encounter: Payer: Self-pay | Admitting: Nurse Practitioner

## 2020-09-26 ENCOUNTER — Ambulatory Visit (INDEPENDENT_AMBULATORY_CARE_PROVIDER_SITE_OTHER): Payer: Medicaid - Out of State | Admitting: Nurse Practitioner

## 2020-09-26 ENCOUNTER — Other Ambulatory Visit: Payer: Self-pay | Admitting: Advanced Practice Midwife

## 2020-09-26 VITALS — BP 123/74 | HR 92 | Wt 238.0 lb

## 2020-09-26 DIAGNOSIS — O0993 Supervision of high risk pregnancy, unspecified, third trimester: Secondary | ICD-10-CM

## 2020-09-26 DIAGNOSIS — Z3A38 38 weeks gestation of pregnancy: Secondary | ICD-10-CM

## 2020-09-26 DIAGNOSIS — O24410 Gestational diabetes mellitus in pregnancy, diet controlled: Secondary | ICD-10-CM

## 2020-09-26 NOTE — Patient Instructions (Signed)
[employer]

## 2020-09-26 NOTE — Progress Notes (Signed)
    Subjective:  Jodi King is a 30 y.o. G3P1011 at [redacted]w[redacted]d being seen today for ongoing prenatal care.  She is currently monitored for the following issues for this high-risk pregnancy and has Supervision of high-risk pregnancy; Rh negative state in antepartum period; and Gestational diabetes mellitus, class A1 on their problem list.  Patient reports no complaints.  Contractions: Irregular. Vag. Bleeding: None.  Movement: Present. Denies leaking of fluid.   The following portions of the patient's history were reviewed and updated as appropriate: allergies, current medications, past family history, past medical history, past social history, past surgical history and problem list. Problem list updated.  Objective:   Vitals:   09/26/20 1345  BP: 123/74  Pulse: 92  Weight: 238 lb (108 kg)    Fetal Status: Fetal Heart Rate (bpm): 136 Fundal Height: 41 cm Movement: Present  Presentation: Vertex  General:  Alert, oriented and cooperative. Patient is in no acute distress.  Skin: Skin is warm and dry. No rash noted.   Cardiovascular: Normal heart rate noted  Respiratory: Normal respiratory effort, no problems with respiration noted  Abdomen: Soft, gravid, appropriate for gestational age. Pain/Pressure: Present     Pelvic:  Done  Dilation: 2 Effacement (%): Thick Station: -3  Extremities: Normal range of motion.  Edema: None  Mental Status: Normal mood and affect. Normal behavior. Normal judgment and thought content.   Urinalysis:      Assessment and Plan:  Pregnancy: G3P1011 at [redacted]w[redacted]d  1. Supervision of high risk pregnancy in third trimester Wanting to be in labor.  Has tried many common remedies but is not in labor.  2. Gestational diabetes mellitus, class A1 Discussed possible induction as she is high risk due to gestational diabetes - client consents to induction. Will schedule for induction at or after [redacted]w[redacted]d All glucose levels are in normal range.  Term labor symptoms and  general obstetric precautions including but not limited to vaginal bleeding, contractions, leaking of fluid and fetal movement were reviewed in detail with the patient. Please refer to After Visit Summary for other counseling recommendations.  Return in about 5 weeks (around 10/31/2020) for postpartum appointment.  Nolene Bernheim, RN, MSN, NP-BC Nurse Practitioner, [facility] for [employer], [facility] 09/26/2020 3:53 PM

## 2020-09-28 ENCOUNTER — Telehealth (HOSPITAL_COMMUNITY): Payer: Self-pay | Admitting: *Deleted

## 2020-09-28 NOTE — Telephone Encounter (Signed)
Preadmission screen  

## 2020-10-01 ENCOUNTER — Other Ambulatory Visit (HOSPITAL_COMMUNITY): Payer: Medicaid - Out of State

## 2020-10-03 ENCOUNTER — Inpatient Hospital Stay (HOSPITAL_COMMUNITY)
Admission: AD | Admit: 2020-10-03 | Discharge: 2020-10-05 | DRG: 807 | Disposition: A | Discharge status: Home / Self Care | Payer: Medicaid - Out of State | Attending: Family Medicine | Admitting: Family Medicine

## 2020-10-03 ENCOUNTER — Inpatient Hospital Stay (HOSPITAL_COMMUNITY): Payer: Medicaid - Out of State | Admitting: Anesthesiology

## 2020-10-03 ENCOUNTER — Encounter (HOSPITAL_COMMUNITY): Payer: Self-pay | Admitting: Family Medicine

## 2020-10-03 ENCOUNTER — Other Ambulatory Visit: Payer: Self-pay

## 2020-10-03 ENCOUNTER — Inpatient Hospital Stay (HOSPITAL_COMMUNITY): Payer: Medicaid - Out of State

## 2020-10-03 DIAGNOSIS — Z3A39 39 weeks gestation of pregnancy: Secondary | ICD-10-CM | POA: Diagnosis not present

## 2020-10-03 DIAGNOSIS — O24420 Gestational diabetes mellitus in childbirth, diet controlled: Secondary | ICD-10-CM | POA: Diagnosis present

## 2020-10-03 DIAGNOSIS — Z7982 Long term (current) use of aspirin: Secondary | ICD-10-CM | POA: Diagnosis not present

## 2020-10-03 DIAGNOSIS — O24410 Gestational diabetes mellitus in pregnancy, diet controlled: Secondary | ICD-10-CM | POA: Diagnosis present

## 2020-10-03 DIAGNOSIS — O9902 Anemia complicating childbirth: Secondary | ICD-10-CM | POA: Diagnosis present

## 2020-10-03 DIAGNOSIS — Z6791 Unspecified blood type, Rh negative: Secondary | ICD-10-CM | POA: Diagnosis not present

## 2020-10-03 DIAGNOSIS — O24419 Gestational diabetes mellitus in pregnancy, unspecified control: Secondary | ICD-10-CM | POA: Diagnosis present

## 2020-10-03 DIAGNOSIS — R030 Elevated blood-pressure reading, without diagnosis of hypertension: Secondary | ICD-10-CM | POA: Diagnosis present

## 2020-10-03 DIAGNOSIS — Z8632 Personal history of gestational diabetes: Secondary | ICD-10-CM | POA: Diagnosis present

## 2020-10-03 DIAGNOSIS — O0990 Supervision of high risk pregnancy, unspecified, unspecified trimester: Secondary | ICD-10-CM

## 2020-10-03 DIAGNOSIS — Z87891 Personal history of nicotine dependence: Secondary | ICD-10-CM | POA: Diagnosis not present

## 2020-10-03 DIAGNOSIS — O26899 Other specified pregnancy related conditions, unspecified trimester: Secondary | ICD-10-CM

## 2020-10-03 DIAGNOSIS — Z20822 Contact with and (suspected) exposure to covid-19: Secondary | ICD-10-CM | POA: Diagnosis present

## 2020-10-03 DIAGNOSIS — O163 Unspecified maternal hypertension, third trimester: Secondary | ICD-10-CM | POA: Diagnosis present

## 2020-10-03 DIAGNOSIS — O26893 Other specified pregnancy related conditions, third trimester: Secondary | ICD-10-CM | POA: Diagnosis present

## 2020-10-03 LAB — RESP PANEL BY RT-PCR (FLU A&B, COVID) ARPGX2
Influenza A by PCR: NEGATIVE
Influenza B by PCR: NEGATIVE
SARS Coronavirus 2 by RT PCR: NEGATIVE

## 2020-10-03 LAB — COMPREHENSIVE METABOLIC PANEL
ALT: 11 U/L (ref 0–44)
AST: 18 U/L (ref 15–41)
Albumin: 2.7 g/dL — ABNORMAL LOW (ref 3.5–5.0)
Alkaline Phosphatase: 120 U/L (ref 38–126)
Anion gap: 10 (ref 5–15)
BUN: 6 mg/dL (ref 6–20)
CO2: 22 mmol/L (ref 22–32)
Calcium: 8.9 mg/dL (ref 8.9–10.3)
Chloride: 103 mmol/L (ref 98–111)
Creatinine, Ser: 0.7 mg/dL (ref 0.44–1.00)
GFR, Estimated: >60 mL/min (ref >60)
Glucose, Bld: 97 mg/dL (ref 70–99)
Potassium: 3.8 mmol/L (ref 3.5–5.1)
Sodium: 135 mmol/L (ref 135–145)
Total Bilirubin: 0.6 mg/dL (ref 0.3–1.2)
Total Protein: 6.5 g/dL (ref 6.5–8.1)

## 2020-10-03 LAB — CBC
HCT: 32.8 % — ABNORMAL LOW (ref 36.0–46.0)
Hemoglobin: 10.2 g/dL — ABNORMAL LOW (ref 12.0–15.0)
MCH: 23.8 pg — ABNORMAL LOW (ref 26.0–34.0)
MCHC: 31.1 g/dL (ref 30.0–36.0)
MCV: 76.5 fL — ABNORMAL LOW (ref 80.0–100.0)
Platelets: 235 10*3/uL (ref 150–400)
RBC: 4.29 MIL/uL (ref 3.87–5.11)
RDW: 14.9 % (ref 11.5–15.5)
WBC: 10.7 10*3/uL — ABNORMAL HIGH (ref 4.0–10.5)
nRBC: 0 % (ref 0.0–0.2)

## 2020-10-03 LAB — RPR: RPR Ser Ql: NONREACTIVE

## 2020-10-03 LAB — GLUCOSE, CAPILLARY
Glucose-Capillary: 91 mg/dL (ref 70–99)
Glucose-Capillary: 81 mg/dL (ref 70–99)
Glucose-Capillary: 76 mg/dL (ref 70–99)
Glucose-Capillary: 82 mg/dL (ref 70–99)
Glucose-Capillary: 81 mg/dL (ref 70–99)

## 2020-10-03 LAB — TYPE AND SCREEN
ABO/RH(D): O NEG
Antibody Screen: POSITIVE

## 2020-10-03 MED ORDER — OXYCODONE-ACETAMINOPHEN 5-325 MG PO TABS: 2.0000 | ORAL_TABLET | ORAL | Status: DC | PRN

## 2020-10-03 MED ORDER — ONDANSETRON HCL 4 MG/2ML IJ SOLN: 4.0000 mg | Freq: Four times a day (QID) | INTRAMUSCULAR | Status: DC | PRN

## 2020-10-03 MED ORDER — LACTATED RINGERS IV SOLN
500.0000 mL | Freq: Once | INTRAVENOUS | Status: AC
Start: 2020-10-03 — End: 2020-10-03
  Administered 2020-10-03: 500 mL via INTRAVENOUS

## 2020-10-03 MED ORDER — EPHEDRINE 5 MG/ML INJ: 10.0000 mg | INTRAVENOUS | Status: DC | PRN

## 2020-10-03 MED ORDER — PHENYLEPHRINE 40 MCG/ML (10ML) SYRINGE FOR IV PUSH (FOR BLOOD PRESSURE SUPPORT): 80.0000 ug | PREFILLED_SYRINGE | INTRAVENOUS | Status: DC | PRN

## 2020-10-03 MED ORDER — LACTATED RINGERS IV SOLN: 500.0000 mL | INTRAVENOUS | Status: DC | PRN

## 2020-10-03 MED ORDER — FENTANYL-BUPIVACAINE-NACL 0.5-0.125-0.9 MG/250ML-% EP SOLN
12.0000 mL/h | EPIDURAL | Status: DC | PRN
  Administered 2020-10-03: 12 mL/h via EPIDURAL
  Filled 2020-10-03: qty 250

## 2020-10-03 MED ORDER — FENTANYL-BUPIVACAINE-NACL 0.5-0.125-0.9 MG/250ML-% EP SOLN: 12.0000 mL/h | EPIDURAL | Status: DC | PRN

## 2020-10-03 MED ORDER — LACTATED RINGERS IV SOLN: INTRAVENOUS | Status: DC

## 2020-10-03 MED ORDER — DIPHENHYDRAMINE HCL 50 MG/ML IJ SOLN: 12.5000 mg | INTRAMUSCULAR | Status: DC | PRN

## 2020-10-03 MED ORDER — OXYTOCIN-SODIUM CHLORIDE 30-0.9 UT/500ML-% IV SOLN
1.0000 m[IU]/min | INTRAVENOUS | Status: DC
  Administered 2020-10-03: 2 m[IU]/min via INTRAVENOUS
  Filled 2020-10-03: qty 500

## 2020-10-03 MED ORDER — OXYCODONE-ACETAMINOPHEN 5-325 MG PO TABS: 1.0000 | ORAL_TABLET | ORAL | Status: DC | PRN

## 2020-10-03 MED ORDER — OXYTOCIN BOLUS FROM INFUSION
333.0000 mL | Freq: Once | INTRAVENOUS | Status: AC
  Administered 2020-10-04: 333 mL via INTRAVENOUS

## 2020-10-03 MED ORDER — TERBUTALINE SULFATE 1 MG/ML IJ SOLN: 0.2500 mg | Freq: Once | INTRAMUSCULAR | Status: DC | PRN

## 2020-10-03 MED ORDER — LIDOCAINE HCL (PF) 1 % IJ SOLN: 30.0000 mL | INTRAMUSCULAR | Status: DC | PRN

## 2020-10-03 MED ORDER — SOD CITRATE-CITRIC ACID 500-334 MG/5ML PO SOLN: 30.0000 mL | ORAL | Status: DC | PRN

## 2020-10-03 MED ORDER — FLEET ENEMA 7-19 GM/118ML RE ENEM: 1.0000 | ENEMA | RECTAL | Status: DC | PRN

## 2020-10-03 MED ORDER — OXYTOCIN-SODIUM CHLORIDE 30-0.9 UT/500ML-% IV SOLN
2.5000 [IU]/h | INTRAVENOUS | Status: DC
  Administered 2020-10-04: 2.5 [IU]/h via INTRAVENOUS
  Filled 2020-10-03: qty 500

## 2020-10-03 MED ORDER — FENTANYL CITRATE (PF) 100 MCG/2ML IJ SOLN
100.0000 ug | INTRAMUSCULAR | Status: DC | PRN
  Administered 2020-10-03 (×2): 100 ug via INTRAVENOUS
  Filled 2020-10-03 (×2): qty 2

## 2020-10-03 MED ORDER — LIDOCAINE HCL (PF) 1 % IJ SOLN
INTRAMUSCULAR | Status: DC | PRN
  Administered 2020-10-03: 8 mL via EPIDURAL

## 2020-10-03 MED ORDER — ACETAMINOPHEN 325 MG PO TABS
650.0000 mg | ORAL_TABLET | ORAL | Status: DC | PRN
Start: 2020-10-03 — End: 2020-10-04

## 2020-10-03 NOTE — H&P (Addendum)
OBSTETRIC ADMISSION HISTORY AND PHYSICAL  Jodi King is a 30 y.o. female G2P1011 with IUP at 57w0dby L/10 presenting for IOL for A1GDM. She reports +FMs, No LOF, no VB. Starting to feel contractions.  She plans on bottle feeding. She request condoms for birth control. [facility]  Dating: By L/10 --->  Estimated Date of Delivery: 10/10/20  Sono:    _0 , CWD, normal anatomy, cephalic presentation, posterior lie, 3441g, 83% EFW 7'8  Prenatal History/Complications:  AM5HQIRh negative Anemia BMI 45  Past Medical History: Past Medical History:  Diagnosis Date   Chronic kidney disease    kidney stones   Kidney stones    Miscarriage    Seasonal allergies 07/20/2013   Vaginal Pap smear, abnormal     Past Surgical History: Past Surgical History:  Procedure Laterality Date   FEMUR FRACTURE SURGERY     FRACTURE SURGERY      Obstetrical History: OB History     Gravida  3   Para  1   Term  1   Preterm      AB  1   Living  1      SAB  1   IAB      Ectopic      Multiple      Live Births  1           Social History Social History   Socioeconomic History   Marital status: Married    Spouse name: BAgricultural consultant  Number of children: Not on file   Years of education: Not on file   Highest education level: Not on file  Occupational History   Not on file  Tobacco Use   Smoking status: Former    Packs/day: 0.50    Years: 4.00    Pack years: 2.00    Types: Cigarettes    Quit date: 08/09/2011    Years since quitting: 9.1   Smokeless tobacco: Never  Vaping Use   Vaping Use: Former   Substances: Nicotine  Substance and Sexual Activity   Alcohol use: Not Currently    Comment: not now   Drug use: No   Sexual activity: Yes    Birth control/protection: None  Other Topics Concern   Not on file  Social History Narrative   Not on file   Social Determinants of Health   Financial Resource Strain: Low Risk     Difficulty of Paying Living Expenses: Not hard at all  Food Insecurity: No Food Insecurity   Worried About RCharity fundraiserin the Last Year: Never true   Ran Out of Food in the Last Year: Never true  Transportation Needs: No Transportation Needs   Lack of Transportation (Medical): No   Lack of Transportation (Non-Medical): No  Physical Activity: Insufficiently Active   Days of Exercise per Week: 4 days   Minutes of Exercise per Session: 20 min  Stress: No Stress Concern Present   Feeling of Stress : Only a little  Social Connections: Moderately Integrated   Frequency of Communication with Friends and Family: More than three times a week   Frequency of Social Gatherings with Friends and Family: Three times a week   Attends Religious Services: 1 to 4 times per year   Active Member of Clubs or Organizations: No   Attends CArchivistMeetings: Never   Marital Status: Married    Family History: Family History  Problem Relation Age of  Onset   Diabetes Mother    Kidney disease Mother    Hypertension Mother    Nephrolithiasis Mother    Hypertension Sister     Allergies: Allergies  Allergen Reactions   Bee Venom Swelling   Lavender Oil Shortness Of Breath    Medications Prior to Admission  Medication Sig Dispense Refill Last Dose   ACCU-CHEK GUIDE test strip USE AS INSTRUCTED TO CHECK BLOOD SUGAR 4 TIMES DAILY 50 strip 12 Past Week   Accu-Chek Softclix Lancets lancets Use as instructed to check blood sugar 4 times daily 100 each 12 Past Week   aspirin 81 MG EC tablet Take 1 tablet (81 mg total) by mouth daily. Swallow whole. 90 tablet 3 Past Week   Blood Glucose Monitoring Suppl (ACCU-CHEK GUIDE) w/Device KIT USE 4 TIMES DAILY 1 kit 0 Past Week   docusate sodium (COLACE) 100 MG capsule Take 100 mg by mouth 2 (two) times daily.   Past Week   ferrous sulfate 325 (65 FE) MG tablet Take 1 tablet (325 mg total) by mouth every other day. 45 tablet 2 Past Week   omeprazole  (PRILOSEC) 20 MG capsule Take 1 capsule (20 mg total) by mouth daily. (Patient taking differently: Take 20 mg by mouth as needed.) 30 capsule 6 Past Week   ondansetron (ZOFRAN ODT) 4 MG disintegrating tablet Take 1 tablet (4 mg total) by mouth every 6 (six) hours as needed for nausea. 30 tablet 2 Past Week   Prenatal Vit-Fe Fumarate-FA (MULTIVITAMIN-PRENATAL) 27-0.8 MG TABS tablet Take 1 tablet by mouth daily at 12 noon.   Past Week     Review of Systems   All systems reviewed and negative except as stated in HPI  Blood pressure 130/81, pulse 88, temperature 98.4 F (36.9 C), temperature source Oral, resp. rate 16, height 5' (1.524 m), weight 108.3 kg, last menstrual period 01/04/2020, SpO2 99 %, unknown if currently breastfeeding. General appearance: alert, cooperative, appears stated age, and no distress Lungs: non-labored respirations Heart: regular rate Abdomen: gravid Presentation: cephalic, confirmed by BSUS Fetal monitoringBaseline: 130 bpm, Variability: Good {> 6 bpm), Accelerations: Reactive, and Decelerations: Absent Uterine activityFrequency: Every 3-5 minutes Dilation: 2 Effacement (%): 50 Station: -3 Exam by:: Keith Rake RN   Prenatal labs: ABO, Rh: O/Negative/-- (12/30 1030) Antibody: Negative (04/01 0831) Rubella: 3.01 (12/30 1030) RPR: Non Reactive (04/01 0831)  HBsAg: Negative (12/30 1030)  HIV: Non Reactive (04/01 0831)  GBS: Negative/-- (06/02 1126)  2 hr Glucola 94/133/117 Genetic screening  NT/IT neg, Panorama low risk Anatomy US nl  Prenatal Transfer Tool  Maternal Diabetes: Yes:  Diabetes Type:  Diet controlled Genetic Screening: Normal Maternal Ultrasounds/Referrals: Normal Fetal Ultrasounds or other Referrals:  None Maternal Substance Abuse:  No Significant Maternal Medications:  Meds include: Other: omeprazole Significant Maternal Lab Results: Group B Strep negative and Rh negative  Results for orders placed or performed during the hospital  encounter of 10/03/20 (from the past 24 hour(s))  Glucose, capillary   Collection Time: 10/03/20  9:30 AM  Result Value Ref Range   Glucose-Capillary 91 70 - 99 mg/dL    Patient Active Problem List   Diagnosis Date Noted   Gestational diabetes 10/03/2020   Gestational diabetes mellitus, class A1 07/16/2020   Supervision of high-risk pregnancy 04/11/2020   Rh negative state in antepartum period 04/11/2020    Assessment/Plan:  Jodi King is a 30 y.o. G3P1011 at 52w0dhere for IOL secondary to A1GDM  #IOL: Latent, started on Pitocin currently  at 2 mu/min. Anticipate NSVD. #Pain: Per patient request, patient can have epidural if desired #FWB: Cat I #ID:  GBS neg #MOF: bottle #MOC: condoms #Circ:  N/A #A1GDM: CBG q4h latent labor, q2h active labor #anemia: Hgb 10.2 on admission  Zola Button, MD  10/03/2020, 10:10 AM  Midwife attestation: I have seen and examined this patient; I agree with above documentation in the resident's note.   PE: Gen: calm comfortable, NAD Resp: normal effort and rate Abd: gravid  ROS, labs, PMH reviewed  Assessment/Plan: [redacted] weeks gestation Labor: latent FWB: Cat I GBS: neg Admit to LD Pitocin IOL Anticipate SVD  Julianne Handler, CNM  10/03/2020, 12:24 PM

## 2020-10-03 NOTE — Progress Notes (Signed)
Labor Progress Note Dezyrae Kensinger is a 30 y.o. G3P1011 at [redacted]w[redacted]d presented for IOL for A1GDM  S:  Feeling more pain with ctx, had recent dose of Fentanyl.  O:  BP (!) 143/76   Pulse 81   Temp 98.4 F (36.9 C) (Oral)   Resp 16   Ht 5' (1.524 m)   Wt 108.3 kg   LMP 01/04/2020 (Exact Date)   SpO2 99%   BMI 46.64 kg/m  EFM: baseline 130 bpm/ mod variability/ + accels/ no decels  Toco/IUPC: 2-3 SVE: Dilation: 3 Effacement (%): 50 Cervical Position: Posterior Station: -3 Presentation: Vertex Exam by:: Valarie Merino RN Pitocin: 14 mu/min  A/P: 30 y.o. G3P1011 [redacted]w[redacted]d  1. Labor: latent 2. FWB: Cat I 3. Pain: analgesia/anesthesia/NO prn 4. A1GDM-stable  Continue Pitocin. AROM if appropriate. Anticipate SVD.  Donette Larry, CNM 3:39 PM

## 2020-10-03 NOTE — Progress Notes (Addendum)
Labor Progress Note Jodi King is a 30 y.o. G3P1011 at [redacted]w[redacted]d presented for IOL for A1GDM S: Starting to feel more pain with contractions, about to get an epidural soon. IV pain medications were not effective.  O:  BP 131/74   Pulse 91   Temp 98.1 F (36.7 C) (Axillary)   Resp 16   Ht 5' (1.524 m)   Wt 108.3 kg   LMP 01/04/2020 (Exact Date)   SpO2 99%   BMI 46.64 kg/m  EFM: baseline 125/moderate variability/+accels/no decels Toco: contractions every 1-4 min  CVE: Dilation: 4.5 Effacement (%): 50 Cervical Position: Posterior Station: -3 Presentation: Vertex Exam by:: Valarie Merino RN   A&P: 30 y.o. G3P1011 [redacted]w[redacted]d  #IOL: Latent, progressing well. Pitocin at 16 mu/min currently. #Pain: epidural pending #FWB: cat I #GBS negative #A1GDM: CBGs controlled so far, monitor CBG q4h latent labor, q2h active labor #Elevated BP: single mild range BP (143/76), continue to monitor   Littie Deeds, MD 6:40 PM   Midwife attestation I agree with the documentation in the resident's note.   Donette Larry, CNM 7:48 PM

## 2020-10-03 NOTE — Anesthesia Preprocedure Evaluation (Signed)
Anesthesia Evaluation  Patient identified by MRN, date of birth, ID band Patient awake    Reviewed: Allergy & Precautions, H&P , NPO status , Patient's Chart, lab work & pertinent test results, reviewed documented beta blocker date and time   Airway Mallampati: II  TM Distance: >3 FB Neck ROM: full    Dental no notable dental hx.    Pulmonary neg pulmonary ROS, former smoker,    Pulmonary exam normal breath sounds clear to auscultation       Cardiovascular negative cardio ROS Normal cardiovascular exam Rhythm:regular Rate:Normal     Neuro/Psych negative neurological ROS  negative psych ROS   GI/Hepatic negative GI ROS, Neg liver ROS,   Endo/Other  negative endocrine ROS  Renal/GU negative Renal ROS  negative genitourinary   Musculoskeletal   Abdominal   Peds  Hematology negative hematology ROS (+)   Anesthesia Other Findings   Reproductive/Obstetrics (+) Pregnancy                             Anesthesia Physical Anesthesia Plan  ASA: 2  Anesthesia Plan: Epidural   Post-op Pain Management:    Induction:   PONV Risk Score and Plan: 2  Airway Management Planned: Natural Airway  Additional Equipment: None  Intra-op Plan:   Post-operative Plan:   Informed Consent: I have reviewed the patients History and Physical, chart, labs and discussed the procedure including the risks, benefits and alternatives for the proposed anesthesia with the patient or authorized representative who has indicated his/her understanding and acceptance.     Dental Advisory Given  Plan Discussed with: Anesthesiologist and CRNA  Anesthesia Plan Comments: (Labs checked- platelets confirmed with RN in room. Fetal heart tracing, per RN, reported to be stable enough for sitting procedure. Discussed epidural, and patient consents to the procedure:  included risk of possible headache,backache, failed block,  allergic reaction, and nerve injury. This patient was asked if she had any questions or concerns before the procedure started.)        Anesthesia Quick Evaluation

## 2020-10-03 NOTE — Progress Notes (Signed)
Labor Progress Note Jodi King is a 30 y.o. G3P1011 at [redacted]w[redacted]d presented for IOL for A1GDM  S: Doing well without complaints, just woke up from nap.  O:  BP (!) 109/59   Pulse 84   Temp 98.1 F (36.7 C) (Axillary)   Resp 16   Ht 5' (1.524 m)   Wt 108.3 kg   LMP 01/04/2020 (Exact Date)   SpO2 98%   BMI 46.64 kg/m  EFM: baseline 130/moderate variability/+accels/no decels Toco: contractions every 1-2 min  CVE: Dilation: 6 Effacement (%): 50 Cervical Position: Posterior Station: -2 Presentation: Vertex Exam by:: Valarie Merino RN   A&P: 30 y.o. G3P1011 [redacted]w[redacted]d  #IOL: Progressing well. Pitocin at 16 mu/min currently. AROM with clear fluid @2220 . #Pain: epidural #FWB: cat I #GBS negative #A1GDM: CBGs controlled so far, q2h active labor #Elevated BP: single mild range BP (143/76), also has had elevated SBP 130s, BP wnl now. Will obtain preE labs. Asymptomatic. No diagnosis, continue to monitor.   , MD 10:25 PM

## 2020-10-04 ENCOUNTER — Encounter (HOSPITAL_COMMUNITY): Payer: Self-pay | Admitting: Family Medicine

## 2020-10-04 DIAGNOSIS — Z3A39 39 weeks gestation of pregnancy: Secondary | ICD-10-CM

## 2020-10-04 DIAGNOSIS — O24420 Gestational diabetes mellitus in childbirth, diet controlled: Secondary | ICD-10-CM

## 2020-10-04 DIAGNOSIS — O163 Unspecified maternal hypertension, third trimester: Secondary | ICD-10-CM | POA: Diagnosis present

## 2020-10-04 LAB — PROTEIN / CREATININE RATIO, URINE
Creatinine, Urine: 125.87 mg/dL
Protein Creatinine Ratio: 0.09 mg/mg{Cre} (ref 0.00–0.15)
Total Protein, Urine: 11 mg/dL

## 2020-10-04 LAB — GLUCOSE, CAPILLARY: Glucose-Capillary: 97 mg/dL (ref 70–99)

## 2020-10-04 MED ORDER — PRENATAL MULTIVITAMIN CH
1.0000 | ORAL_TABLET | Freq: Every day | ORAL | Status: DC
  Administered 2020-10-04 – 2020-10-05 (×2): 1 via ORAL
  Filled 2020-10-04 (×2): qty 1

## 2020-10-04 MED ORDER — WITCH HAZEL-GLYCERIN EX PADS
1.0000 "application " | MEDICATED_PAD | CUTANEOUS | Status: DC | PRN
  Administered 2020-10-05: 1 via TOPICAL

## 2020-10-04 MED ORDER — IBUPROFEN 600 MG PO TABS
600.0000 mg | ORAL_TABLET | Freq: Four times a day (QID) | ORAL | Status: DC
  Administered 2020-10-04 – 2020-10-05 (×6): 600 mg via ORAL
  Filled 2020-10-04 (×6): qty 1

## 2020-10-04 MED ORDER — SIMETHICONE 80 MG PO CHEW
80.0000 mg | CHEWABLE_TABLET | ORAL | Status: DC | PRN
  Filled 2020-10-04: qty 1

## 2020-10-04 MED ORDER — MEASLES, MUMPS & RUBELLA VAC IJ SOLR: 0.5000 mL | Freq: Once | INTRAMUSCULAR | Status: DC

## 2020-10-04 MED ORDER — BENZOCAINE-MENTHOL 20-0.5 % EX AERO
1.0000 "application " | INHALATION_SPRAY | CUTANEOUS | Status: DC | PRN
  Administered 2020-10-04: 1 via TOPICAL
  Filled 2020-10-04: qty 56

## 2020-10-04 MED ORDER — DIBUCAINE (PERIANAL) 1 % EX OINT: 1.0000 "application " | TOPICAL_OINTMENT | CUTANEOUS | Status: DC | PRN

## 2020-10-04 MED ORDER — SENNOSIDES-DOCUSATE SODIUM 8.6-50 MG PO TABS
2.0000 | ORAL_TABLET | ORAL | Status: DC
  Administered 2020-10-05: 2 via ORAL
  Filled 2020-10-04 (×2): qty 2

## 2020-10-04 MED ORDER — ACETAMINOPHEN 325 MG PO TABS
650.0000 mg | ORAL_TABLET | ORAL | Status: DC | PRN
  Administered 2020-10-04 (×2): 650 mg via ORAL
  Filled 2020-10-04 (×2): qty 2

## 2020-10-04 MED ORDER — ONDANSETRON HCL 4 MG/2ML IJ SOLN: 4.0000 mg | INTRAMUSCULAR | Status: DC | PRN

## 2020-10-04 MED ORDER — TETANUS-DIPHTH-ACELL PERTUSSIS 5-2.5-18.5 LF-MCG/0.5 IM SUSY: 0.5000 mL | PREFILLED_SYRINGE | Freq: Once | INTRAMUSCULAR | Status: DC

## 2020-10-04 MED ORDER — COCONUT OIL OIL: 1.0000 "application " | TOPICAL_OIL | Status: DC | PRN

## 2020-10-04 MED ORDER — ONDANSETRON HCL 4 MG PO TABS: 4.0000 mg | ORAL_TABLET | ORAL | Status: DC | PRN

## 2020-10-04 MED ORDER — DIPHENHYDRAMINE HCL 25 MG PO CAPS: 25.0000 mg | ORAL_CAPSULE | Freq: Four times a day (QID) | ORAL | Status: DC | PRN

## 2020-10-04 NOTE — Anesthesia Postprocedure Evaluation (Signed)
Anesthesia Post Note  Patient: Jodi King  Procedure(s) Performed: AN AD HOC LABOR EPIDURAL     Patient location during evaluation: Mother Baby Anesthesia Type: Epidural Level of consciousness: awake and alert Pain management: pain level controlled Vital Signs Assessment: post-procedure vital signs reviewed and stable Respiratory status: spontaneous breathing, nonlabored ventilation and respiratory function stable Cardiovascular status: stable Postop Assessment: no headache, no backache and epidural receding Anesthetic complications: no   No notable events documented.  Last Vitals:  Vitals:   10/04/20 1000 10/04/20 1244  BP: 121/68 125/66  Pulse: 96 (!) 110  Resp: 16 16  Temp: 36.7 C 36.8 C  SpO2: 99% 99%    Last Pain:  Vitals:   10/04/20 1342  TempSrc:   PainSc: 3                  [name]

## 2020-10-04 NOTE — Discharge Summary (Addendum)
Postpartum Discharge Summary     Patient Name: Jodi King DOB: Feb 15, 1991 MRN: [id number]  Date of admission: 10/03/2020 Delivery date:10/04/2020  Delivering provider: Arrie Senate  Date of discharge: 10/05/2020  Admitting diagnosis: Gestational diabetes [O24.419] Intrauterine pregnancy: [redacted]w[redacted]d    Secondary diagnosis:  Active Problems:   Supervision of high-risk pregnancy   Rh negative state in antepartum period   Gestational diabetes mellitus, class A1   Gestational diabetes   Elevated blood pressure affecting pregnancy in third trimester, antepartum   Vaginal delivery  Additional problems: none    Discharge diagnosis: Term Pregnancy Delivered, GDM A1, and Anemia                                              Post partum procedures: none Augmentation: AROM and Pitocin Complications: None  Hospital course: Induction of Labor With Vaginal Delivery   30y.o. yo G3P1011 at 344w1das admitted to the hospital 10/03/2020 for induction of labor.  Indication for induction: A1 DM.  Patient had an uncomplicated labor course as follows: Membrane Rupture Time/Date: 10:20 PM ,10/03/2020   Delivery Method:Vaginal, Spontaneous  Episiotomy: None  Lacerations:  1st degree  Details of delivery can be found in separate delivery note.  Patient had a routine postpartum course. Fasting CBG 84. Patient is discharged home 10/05/20.  Newborn Data: Birth date:10/04/2020  Birth time:2:39 AM  Gender:Female  Living status:Living  Apgars:9 ,9  Weight:3286 g   Magnesium Sulfate received: No BMZ received: No Rhophylac:No MMR:N/A T-DaP:Given prenatally Flu: No Transfusion:No  Physical exam  Vitals:   10/04/20 1244 10/04/20 1710 10/04/20 2257 10/05/20 0659  BP: 125/66 132/88 100/80 118/63  Pulse: (!) 110 78 98 94  Resp: 16 18 16 16   Temp: 98.3 F (36.8 C) 98 F (36.7 C) 98.9 F (37.2 C) 97.6 F (36.4 C)  TempSrc: Oral Oral Oral Oral  SpO2: 99%  98% 100%  Weight:      Height:        General: alert, cooperative, and no distress Lochia: appropriate Uterine Fundus: firm Incision: N/A DVT Evaluation: No significant calf/ankle edema. Labs: Lab Results  Component Value Date   WBC 10.7 (H) 10/03/2020   HGB 10.2 (L) 10/03/2020   HCT 32.8 (L) 10/03/2020   MCV 76.5 (L) 10/03/2020   PLT 235 10/03/2020   CMP Latest Ref Rng & Units 10/03/2020  Glucose 70 - 99 mg/dL 97  BUN 6 - 20 mg/dL 6  Creatinine 0.44 - 1.00 mg/dL 0.70  Sodium 135 - 145 mmol/L 135  Potassium 3.5 - 5.1 mmol/L 3.8  Chloride 98 - 111 mmol/L 103  CO2 22 - 32 mmol/L 22  Calcium 8.9 - 10.3 mg/dL 8.9  Total Protein 6.5 - 8.1 g/dL 6.5  Total Bilirubin 0.3 - 1.2 mg/dL 0.6  Alkaline Phos 38 - 126 U/L 120  AST 15 - 41 U/L 18  ALT 0 - 44 U/L 11   Edinburgh Score: Edinburgh Postnatal Depression Scale Screening Tool 10/04/2020  I have been able to laugh and see the funny side of things. (No Data)     After visit meds:  Allergies as of 10/05/2020       Reactions   Bee Venom Swelling   Lavender Oil Shortness Of Breath        Medication List     STOP taking these medications  aspirin 81 MG EC tablet       TAKE these medications    Accu-Chek Guide test strip Generic drug: glucose blood USE AS INSTRUCTED TO CHECK BLOOD SUGAR 4 TIMES DAILY   Accu-Chek Guide w/Device Kit USE 4 TIMES DAILY   Accu-Chek Softclix Lancets lancets Use as instructed to check blood sugar 4 times daily   acetaminophen 325 MG tablet Commonly known as: Tylenol Take 2 tablets (650 mg total) by mouth every 4 (four) hours as needed (for pain scale < 4).   coconut oil Oil Apply 1 application topically as needed.   docusate sodium 100 MG capsule Commonly known as: COLACE Take 100 mg by mouth 2 (two) times daily.   ferrous sulfate 325 (65 FE) MG tablet Take 1 tablet (325 mg total) by mouth every other day.   ibuprofen 600 MG tablet Commonly known as: ADVIL Take 1 tablet (600 mg total) by mouth every 6  (six) hours.   multivitamin-prenatal 27-0.8 MG Tabs tablet Take 1 tablet by mouth daily at 12 noon.   ondansetron 4 MG disintegrating tablet Commonly known as: Zofran ODT Take 1 tablet (4 mg total) by mouth every 6 (six) hours as needed for nausea.       ASK your doctor about these medications    omeprazole 20 MG capsule Commonly known as: PriLOSEC Take 1 capsule (20 mg total) by mouth daily.         Discharge home in stable condition Infant Feeding: Bottle Infant Disposition:home with mother Discharge instruction: per After Visit Summary and Postpartum booklet. Activity: Advance as tolerated. Pelvic rest for 6 weeks.  Diet: routine diet Future Appointments: Future Appointments  Date Time Provider [place]  10/31/2020  1:50 PM Roma Schanz, CNM [facility]   Follow up Visit: Message sent to [facility] 10/04/20 by Sylvester Harder.   Please schedule this patient for a In person postpartum visit in 6 weeks with the following provider: Any provider. Additional Postpartum F/U:2 hour GTT  High risk pregnancy complicated by: GDM Delivery mode:  Vaginal, Spontaneous  Anticipated Birth Control:  Condoms   10/05/2020 Zola Button, MD  GME ATTESTATION:  I saw and evaluated the patient. I agree with the findings and the plan of care as documented in the resident's note.  Arrie Senate, MD OB Fellow, [place] [facility] [place] 10/05/2020 8:25 AM

## 2020-10-05 LAB — GLUCOSE, CAPILLARY: Glucose-Capillary: 84 mg/dL (ref 70–99)

## 2020-10-05 MED ORDER — COCONUT OIL OIL: 1.0000 "application " | TOPICAL_OIL | 0 refills | Status: DC | PRN

## 2020-10-05 MED ORDER — IBUPROFEN 600 MG PO TABS
600.0000 mg | ORAL_TABLET | Freq: Four times a day (QID) | ORAL | 0 refills | Status: AC
Start: 2020-10-05 — End: ?

## 2020-10-05 MED ORDER — ACETAMINOPHEN 325 MG PO TABS: 650.0000 mg | ORAL_TABLET | ORAL | Status: AC | PRN

## 2020-10-09 LAB — BIRTH TISSUE RECOVERY COLLECTION (PLACENTA DONATION)

## 2020-10-16 ENCOUNTER — Telehealth (HOSPITAL_COMMUNITY): Payer: Self-pay

## 2020-10-16 NOTE — Telephone Encounter (Signed)
No answer- message left to return nurse call.   Marcelino Duster [facility] 10/16/2020,1910

## 2020-10-22 ENCOUNTER — Other Ambulatory Visit: Payer: Self-pay | Admitting: Advanced Practice Midwife

## 2020-10-31 ENCOUNTER — Other Ambulatory Visit: Payer: Self-pay

## 2020-10-31 ENCOUNTER — Ambulatory Visit (INDEPENDENT_AMBULATORY_CARE_PROVIDER_SITE_OTHER): Payer: Medicaid - Out of State | Admitting: Women's Health

## 2020-10-31 ENCOUNTER — Encounter: Payer: Self-pay | Admitting: Women's Health

## 2020-10-31 DIAGNOSIS — Z8632 Personal history of gestational diabetes: Secondary | ICD-10-CM | POA: Diagnosis not present

## 2020-10-31 NOTE — Patient Instructions (Signed)
You will have your sugar test next visit.  Please do not eat or drink anything after midnight the night before you come, not even water.  You will be here for at least two hours.  Please make an appointment online for the bloodwork at [email or web address] for 8:30am (or as close to this as possible). Make sure you select the [address] service center. The day of the appointment, check in with our office first, then you will go to [facility] to start the sugar test.   

## 2020-10-31 NOTE — Progress Notes (Signed)
POSTPARTUM VISIT Patient name: Jodi King MRN [id number]  Date of birth: 1990-08-25 Chief Complaint:   Postpartum Care (Still having soreness in lower stomach)  History of Present Illness:   Jodi King is a 30 y.o. G60P2012 Caucasian female being seen today for a postpartum visit. She is 4 weeks postpartum following a spontaneous vaginal delivery at 39.1 gestational weeks. IOL: yes, for diabetes mellitus A1DM. Anesthesia: epidural.  Laceration: 1st degree.  Complications: none. Inpatient contraception: no.   Pregnancy complicated by G2RK . Tobacco use: former . Substance use disorder: no. Last pap smear: 04/12/20 and results were NILM w/ HRHPV negative. Next pap smear due: 2024 Patient's last menstrual period was 01/04/2020 (exact date).  Postpartum course has been uncomplicated. Has had some lower abdominal pain w/ lifting, moving certain ways, etc. Bleeding none. Bowel function is normal. Bladder function is normal. Urinary incontinence? no, fecal incontinence? no Patient is not sexually active. Last sexual activity: prior to birth of baby. Desired contraception: Condoms. Patient does not know want a pregnancy in the future.  Desired family size is [place] #of children.   Upstream - 10/31/20 1412       Pregnancy Intention Screening   Does the patient want to become pregnant in the next year? No    Does the patient's partner want to become pregnant in the next year? No    Would the patient like to discuss contraceptive options today? No      Contraception Wrap Up   Current Method Abstinence    End Method Female Condom            The pregnancy intention screening data noted above was reviewed. Potential methods of contraception were discussed. The patient elected to proceed with Female Condom.  Edinburgh Postpartum Depression Screening: negative  Edinburgh Postnatal Depression Scale - 10/31/20 1413       Edinburgh Postnatal Depression Scale:  In the Past 7 Days   I have  been able to laugh and see the funny side of things. 0    I have looked forward with enjoyment to things. 0    I have blamed myself unnecessarily when things went wrong. 0    I have been anxious or worried for no good reason. 2    I have felt scared or panicky for no good reason. 0    Things have been getting on top of me. 0    I have been so unhappy that I have had difficulty sleeping. 0    I have felt sad or miserable. 0    I have been so unhappy that I have been crying. 0    The thought of harming myself has occurred to me. 0    Edinburgh Postnatal Depression Scale Total 2             GAD 7 : Generalized Anxiety Score 07/13/2020 04/12/2020  Nervous, Anxious, on Edge 1 1  Control/stop worrying 0 1  Worry too much - different things 0 0  Trouble relaxing 1 1  Restless 0 0  Easily annoyed or irritable 0 0  Afraid - awful might happen 0 0  Total GAD 7 Score 2 3     Baby's course has been uncomplicated. Baby is feeding by bottle. Infant has a pediatrician/family doctor? Yes.  Childcare strategy if returning to work/school: n/a-stay at home mom.  Pt has material needs met for her and baby: Yes.   Review of Systems:   Pertinent items are noted in HPI  Denies Abnormal vaginal discharge w/ itching/odor/irritation, headaches, visual changes, shortness of breath, chest pain, abdominal pain, severe nausea/vomiting, or problems with urination or bowel movements. Pertinent History Reviewed:  Reviewed past medical,surgical, obstetrical and family history.  Reviewed problem list, medications and allergies. OB History  Gravida Para Term Preterm AB Living  _0 SAB IAB Ectopic Multiple Live Births  1     0 2    # Outcome Date GA Lbr Len/2nd Weight Sex Delivery Anes PTL Lv  3 Term 10/04/20 [redacted]w[redacted]d/ 00:24 7 lb 3.9 oz (3.286 kg) F Vag-Spont EPI  LIV  2 SAB 06/07/19 [id number]       1 Term 10/11/13 [id number] / 00:59 7 lb 0.4 oz (3.187 kg) M Vag-Spont EPI  LIV   Physical Assessment:    Vitals:   10/31/20 1408  Weight: 221 lb (100.2 kg)  Height: 5' (1.524 m)  Body mass index is 43.16 kg/m.       Physical Examination:   General appearance: alert, well appearing, and in no distress  Mental status: alert, oriented to person, place, and time  Skin: warm & dry   Cardiovascular: normal heart rate noted   Respiratory: normal respiratory effort, no distress   Breasts: deferred, no complaints   Abdomen: soft, non-tender   Pelvic: lac healing well. Thin prep pap obtained: No  Rectal: not examined  Extremities: Edema: none   Chaperone: JanLevy Pupa      No results found for this or any previous visit (from the past 24 hour(s)).  Assessment & Plan:  1) Postpartum exam 2) 4 wks s/p spontaneous vaginal delivery after IOL for A1DM 3) bottle feeding 4) Depression screening 5) Contraception counseling> plans condoms 6) A1DM during pregnancy> schedule GTT 7) Low abd pain w/ lifting/moving certain ways> musculoskeletal, move positions slowly, etc . Let us [place] if worsening/not improving  Essential components of care per ACOG recommendations:  1.  Mood and well being:  If positive depression screen, discussed and plan developed.  If using tobacco we discussed reduction/cessation and risk of relapse If current substance abuse, we discussed and referral to local resources was offered.   2. Infant care and feeding:  If breastfeeding, discussed returning to work, pumping, breastfeeding-associated pain, guidance regarding return to fertility while lactating if not using another method. If needed, patient was provided with a letter to be allowed to pump q 2-3hrs to support lactation in a private location with access to a refrigerator to store breastmilk.   Recommended that all caregivers be immunized for flu, pertussis and other preventable communicable diseases If pt does not have material needs met for her/baby, referred to local resources for help obtaining these.  3.  Sexuality, contraception and birth spacing Provided guidance regarding sexuality, management of dyspareunia, and resumption of intercourse Discussed avoiding interpregnancy interval <6mt27mand recommended birth spacing of 18 months  4. Sleep and fatigue Discussed coping options for fatigue and sleep disruption Encouraged family/partner/community support of 4 hrs of uninterrupted sleep to help with mood and fatigue  5. Physical recovery  If pt had a C/S, assessed incisional pain and providing guidance on normal vs prolonged recovery If pt had a laceration, perineal healing and pain reviewed.  If urinary or fecal incontinence, discussed management and referred to PT or uro/gyn if indicated  Patient is safe to resume physical activity. Discussed attainment of healthy weight.  6.  Chronic disease management Discussed pregnancy complications  if any, and their implications for future childbearing and long-term maternal health. Review recommendations for prevention of recurrent pregnancy complications, such as 17 hydroxyprogesterone caproate to reduce risk for recurrent PTB not applicable, or aspirin to reduce risk of preeclampsia not applicable. Pt had GDM: yes. If yes, 2hr GTT scheduled: yes. Reviewed medications and non-pregnant dosing including consideration of whether pt is breastfeeding using a reliable resource such as LactMed: not applicable Referred for f/u w/ PCP or subspecialist providers as indicated: not applicable  7. Health maintenance Mammogram at 29yo or earlier if indicated Pap smears as indicated  Meds: No orders of the defined types were placed in this encounter.   Follow-up: Return in about 4 weeks (around 11/28/2020) for 2hr postpartum sugar test; then 46yrfor physical.   No orders of the defined types were placed in this encounter.   KGlen Osborne [facility]7/20/2022 2:41 PM

## 2020-11-28 ENCOUNTER — Other Ambulatory Visit: Payer: Medicaid - Out of State
